# Patient Record
Sex: Female | Born: 1990 | Race: White | Hispanic: No | Marital: Married | Smoking: Former smoker
Health system: Southern US, Community
[De-identification: ages and names within clinical notes are randomized; demographics above are authoritative.]

## PROBLEM LIST (undated history)

## (undated) DIAGNOSIS — G43109 Migraine with aura, not intractable, without status migrainosus: Secondary | ICD-10-CM

## (undated) DIAGNOSIS — F32A Depression, unspecified: Secondary | ICD-10-CM

## (undated) DIAGNOSIS — B2790 Infectious mononucleosis, unspecified without complication: Secondary | ICD-10-CM

## (undated) DIAGNOSIS — F909 Attention-deficit hyperactivity disorder, unspecified type: Secondary | ICD-10-CM

## (undated) DIAGNOSIS — J309 Allergic rhinitis, unspecified: Secondary | ICD-10-CM

## (undated) DIAGNOSIS — R011 Cardiac murmur, unspecified: Secondary | ICD-10-CM

## (undated) DIAGNOSIS — F419 Anxiety disorder, unspecified: Secondary | ICD-10-CM

## (undated) DIAGNOSIS — T7840XA Allergy, unspecified, initial encounter: Secondary | ICD-10-CM

## (undated) HISTORY — DX: Migraine with aura, not intractable, without status migrainosus: G43.109

## (undated) HISTORY — DX: Allergy, unspecified, initial encounter: T78.40XA

## (undated) HISTORY — DX: Infectious mononucleosis, unspecified without complication: B27.90

## (undated) HISTORY — DX: Anxiety disorder, unspecified: F41.9

## (undated) HISTORY — DX: Cardiac murmur, unspecified: R01.1

## (undated) HISTORY — PX: WISDOM TOOTH EXTRACTION: SHX21

## (undated) HISTORY — DX: Allergic rhinitis, unspecified: J30.9

---

## 2016-01-11 ENCOUNTER — Encounter: Payer: Self-pay | Admitting: Emergency Medicine

## 2016-01-11 ENCOUNTER — Emergency Department
Admission: EM | Admit: 2016-01-11 | Discharge: 2016-01-11 | Disposition: A | Discharge status: Home / Self Care | Payer: BLUE CROSS/BLUE SHIELD | Attending: Emergency Medicine | Admitting: Emergency Medicine

## 2016-01-11 ENCOUNTER — Emergency Department: Payer: BLUE CROSS/BLUE SHIELD

## 2016-01-11 DIAGNOSIS — R1011 Right upper quadrant pain: Secondary | ICD-10-CM | POA: Insufficient documentation

## 2016-01-11 DIAGNOSIS — Z3202 Encounter for pregnancy test, result negative: Secondary | ICD-10-CM | POA: Diagnosis not present

## 2016-01-11 DIAGNOSIS — Z880 Allergy status to penicillin: Secondary | ICD-10-CM | POA: Diagnosis not present

## 2016-01-11 DIAGNOSIS — Z87891 Personal history of nicotine dependence: Secondary | ICD-10-CM | POA: Insufficient documentation

## 2016-01-11 LAB — URINALYSIS COMPLETE WITH MICROSCOPIC (ARMC ONLY)
BILIRUBIN URINE: NEGATIVE
Bacteria, UA: NONE SEEN
Glucose, UA: NEGATIVE mg/dL
Hgb urine dipstick: NEGATIVE
KETONES UR: NEGATIVE mg/dL
LEUKOCYTES UA: NEGATIVE
NITRITE: NEGATIVE
PH: 7 (ref 5.0–8.0)
PROTEIN: NEGATIVE mg/dL
SPECIFIC GRAVITY, URINE: 1.017 (ref 1.005–1.030)

## 2016-01-11 LAB — COMPREHENSIVE METABOLIC PANEL
ALK PHOS: 49 U/L (ref 38–126)
ALT: 20 U/L (ref 14–54)
AST: 22 U/L (ref 15–41)
Albumin: 4.3 g/dL (ref 3.5–5.0)
Anion gap: 6 (ref 5–15)
BILIRUBIN TOTAL: 1 mg/dL (ref 0.3–1.2)
BUN: 15 mg/dL (ref 6–20)
CALCIUM: 9 mg/dL (ref 8.9–10.3)
CHLORIDE: 112 mmol/L — AB (ref 101–111)
CO2: 23 mmol/L (ref 22–32)
CREATININE: 0.87 mg/dL (ref 0.44–1.00)
Glucose, Bld: 87 mg/dL (ref 65–99)
Potassium: 4 mmol/L (ref 3.5–5.1)
Sodium: 141 mmol/L (ref 135–145)
TOTAL PROTEIN: 7.6 g/dL (ref 6.5–8.1)

## 2016-01-11 LAB — LIPASE, BLOOD: Lipase: 25 U/L (ref 11–51)

## 2016-01-11 LAB — CBC
HCT: 40.6 % (ref 35.0–47.0)
Hemoglobin: 13.4 g/dL (ref 12.0–16.0)
MCH: 28.8 pg (ref 26.0–34.0)
MCHC: 33 g/dL (ref 32.0–36.0)
MCV: 87.2 fL (ref 80.0–100.0)
PLATELETS: 220 10*3/uL (ref 150–440)
RBC: 4.65 MIL/uL (ref 3.80–5.20)
RDW: 13.7 % (ref 11.5–14.5)
WBC: 9 10*3/uL (ref 3.6–11.0)

## 2016-01-11 LAB — PREGNANCY, URINE: Preg Test, Ur: NEGATIVE

## 2016-01-11 NOTE — Discharge Instructions (Signed)

## 2016-01-11 NOTE — ED Provider Notes (Signed)
[facility] Emergency Department Provider Note  Time seen: 5:49 PM  I have reviewed the triage vital signs and the nursing notes.   HISTORY  Chief Complaint Abdominal Pain    HPI Tiffany [name] is a 25 y.o. female with no past medical history who presents the emergency department with 2-3 days of intermittent right upper quadrant pain. According to the patient approximately 3 days ago she developed some pain to her right mid to upper abdomen, states it lasted several hours and then went away. States it happened one other time and went away briefly. And then back again today. Patient denies any nausea, vomiting, diarrhea, dysuria, vaginal bleeding, vaginal discharge, fever.Denies any known association with food although states her appetite has been decreased over the past several days. Describes the pain as moderate when it occurs, mild currently.    No past medical history on file.  There are no active problems to display for this patient.   No past surgical history on file.  No current outpatient prescriptions on file.  Allergies Penicillins  No family history on file.  Social History Social History  Substance Use Topics  . Smoking status: Former Games developer  . Smokeless tobacco: Not on file  . Alcohol Use: No    Review of Systems Constitutional: Negative for fever. Cardiovascular: Negative for chest pain. Respiratory: Negative for shortness of breath. Gastrointestinal: Right upper quadrant abdominal pain. Negative for nausea, vomiting, diarrhea. Genitourinary: Negative for dysuria. Musculoskeletal: Negative for back pain Neurological: Negative for headaches, focal weakness or numbness.  10-point ROS otherwise negative.  ____________________________________________   PHYSICAL EXAM:  VITAL SIGNS: ED Triage Vitals  Enc Vitals Group     BP 01/11/16 1523 115/72 mmHg     Pulse Rate 01/11/16 1523 65     Resp 01/11/16 1523 16     Temp 01/11/16  1523 97.7 F (36.5 C)     Temp Source 01/11/16 1523 Oral     SpO2 01/11/16 1523 98 %     Weight 01/11/16 1523 155 lb (70.308 kg)     Height 01/11/16 1523  (1.549 m)     Head Cir --      Peak Flow --      Pain Score 01/11/16 1523 5     Pain Loc --      Pain Edu? --      Excl. in GC? --     Constitutional: Alert and oriented. Well appearing and in no distress. Eyes: Normal exam ENT   Head: Normocephalic and atraumatic.   Mouth/Throat: Mucous membranes are moist. Cardiovascular: Normal rate, regular rhythm. No murmur Respiratory: Normal respiratory effort without tachypnea nor retractions. Breath sounds are clear and equal bilaterally. No wheezes/rales/rhonchi. Gastrointestinal: Soft, mild right upper quadrant tenderness to palpation. Abdomen otherwise benign. No rebound or guarding. Distention. Musculoskeletal: Nontender with normal range of motion in all extremities. Neurologic:  Normal speech and language. No gross focal neurologic deficits  Skin:  Skin is warm, dry and intact.  Psychiatric: Mood and affect are normal.   ____________________________________________     RADIOLOGY  Ultrasound within normal limits  ____________________________________________    INITIAL IMPRESSION / ASSESSMENT AND PLAN / ED COURSE  Pertinent labs & imaging results that were available during my care of the patient were reviewed by me and considered in my medical decision making (see chart for details).  Patient presents to the emergency department with intermittent right upper quadrant pain for the past 2-3 days. Contrary to triage  of the patient denies any lower or right lower quadrant abdominal pain. Patient has mild tenderness to palpation in the right upper quadrant currently. States her pain is much reduced from what it was earlier. Patient's workup so far is within normal limits. LFTs and white blood cell count are normal. SGOT it on a lipase as well as a urine pregnancy test.  We will also perform a right upper quadrant ultrasound to rule out biliary disease.  Ultrasound within normal limits. Patient does state she has been having some moderate constipation recently. We'll place on Colace twice a day which she will pick up from the pharmacy. I discussed strict return precautions with the patient and her mother they are agreeable  ____________________________________________   FINAL CLINICAL IMPRESSION(S) / ED DIAGNOSES  Right upper quadrant abdominal pain   Minna Antis, MD 01/11/16 2001

## 2016-01-11 NOTE — ED Notes (Signed)
AAOx3.  Skin warm and dry.  NAD 

## 2016-01-11 NOTE — ED Notes (Signed)
Reports RLQ pain a couple days ago that resolved and then came back this morning.  Denies NVD.  Denies fevers.  Pain is to RLQ but not made worse by anything per pt.

## 2016-01-11 NOTE — ED Notes (Signed)
Pt reports intermittent right lower abd pain.  No n/v/d.  No back pain.  Constipation for past 2-3 days.  No fever.  Pt alert

## 2017-03-15 IMAGING — US US ABDOMEN LIMITED
1 series · 14 of 25 positions shown · non-contrast
Comparison: None.

CLINICAL DATA: Two day history of right upper quadrant pain

EXAM:
US ABDOMEN LIMITED - RIGHT UPPER QUADRANT

[Series 1: us abdomen limited · 0.15mm/px · 14 of 78 slices shown]
[im 1/78]
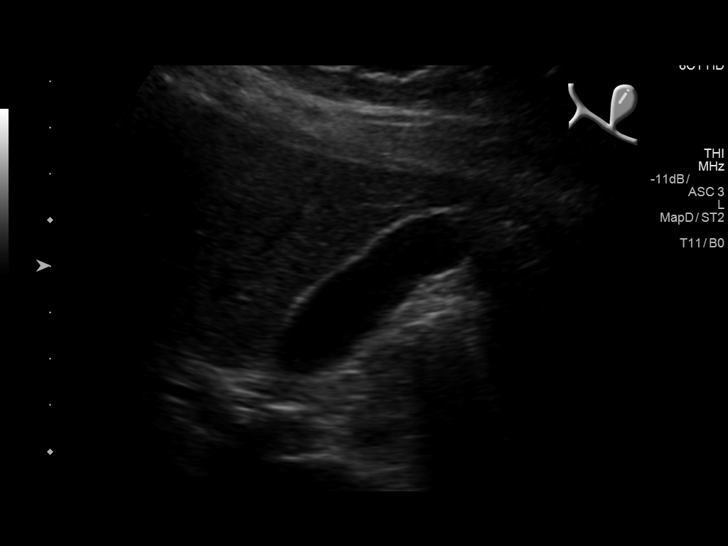
[im 7/78]
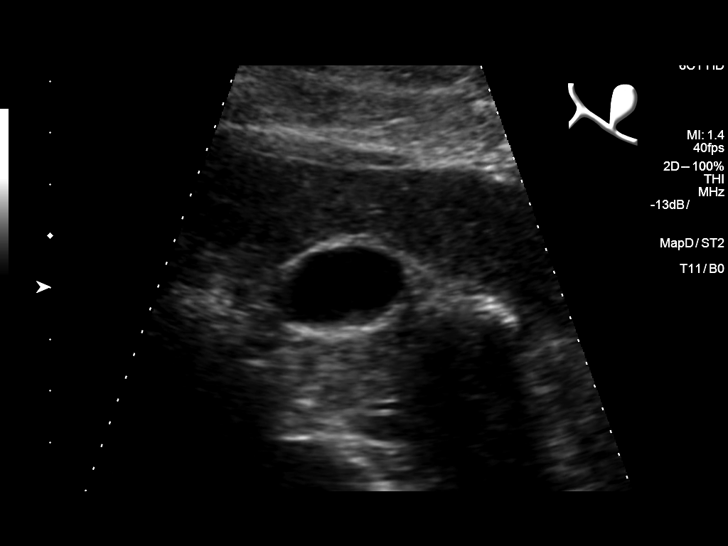
[im 13/78]
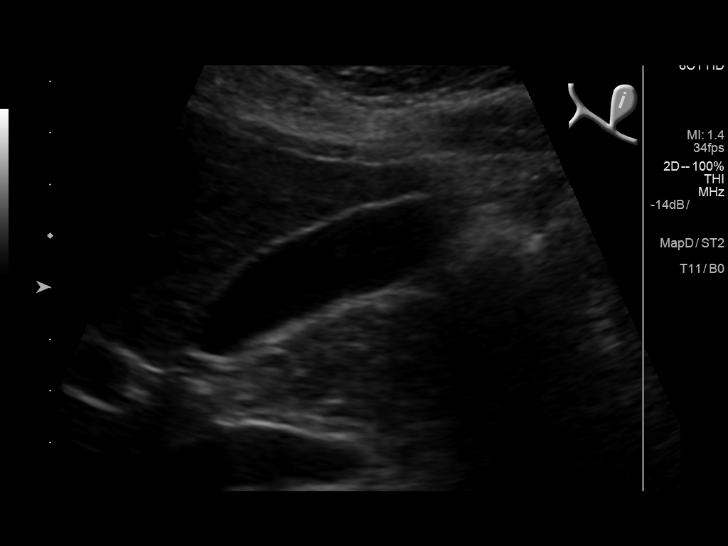
[im 20/78]
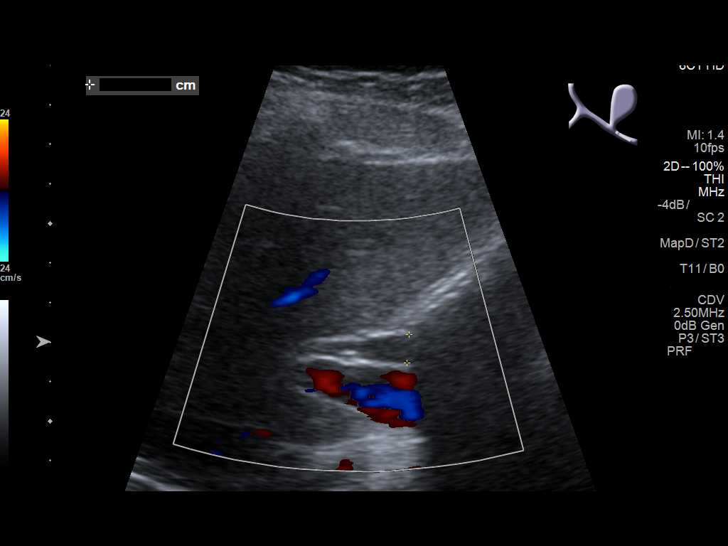
[im 26/78]
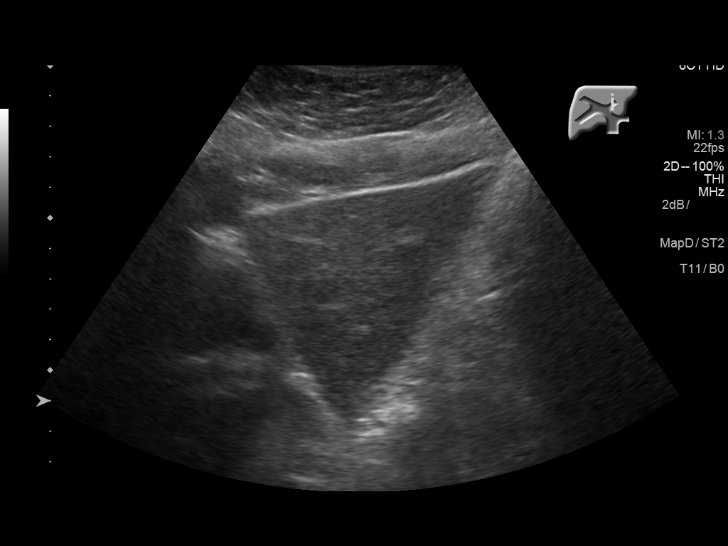
[im 29/78]
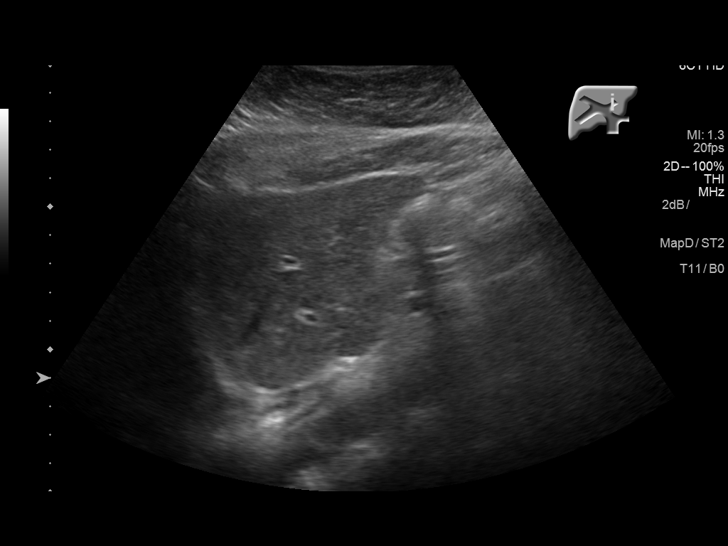
[im 36/78]
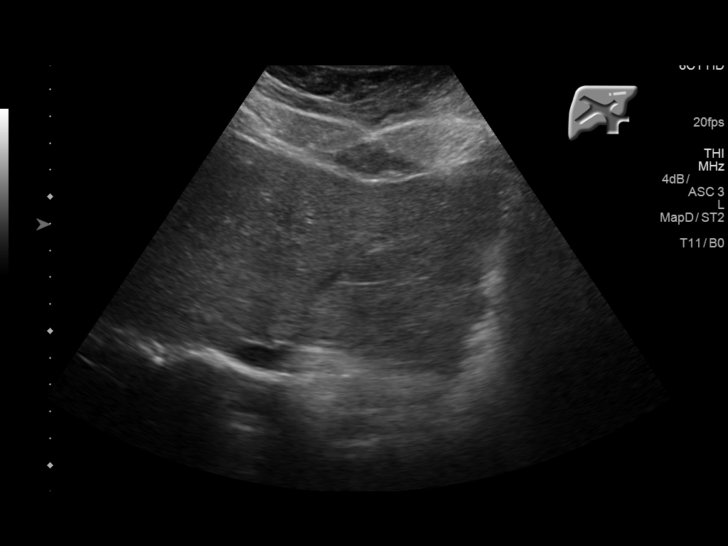
[im 42/78]
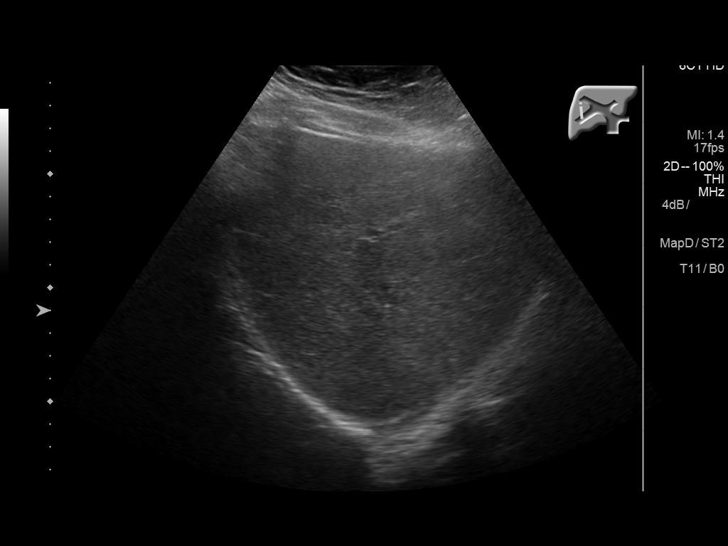
[im 49/78]
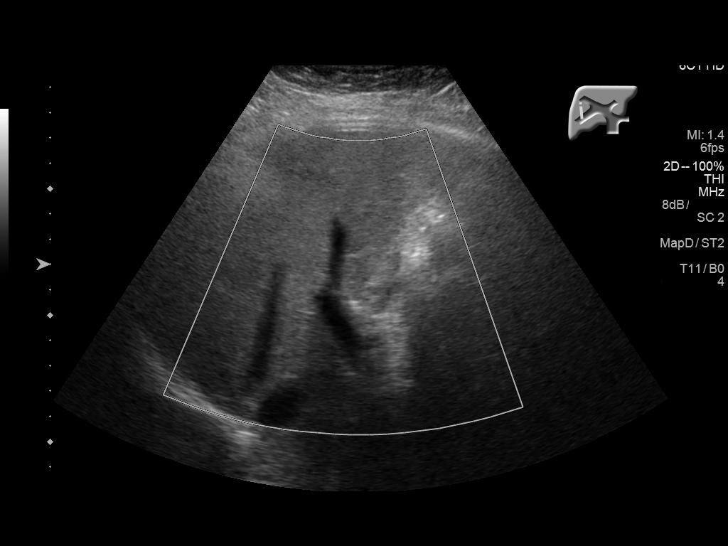
[im 52/78]
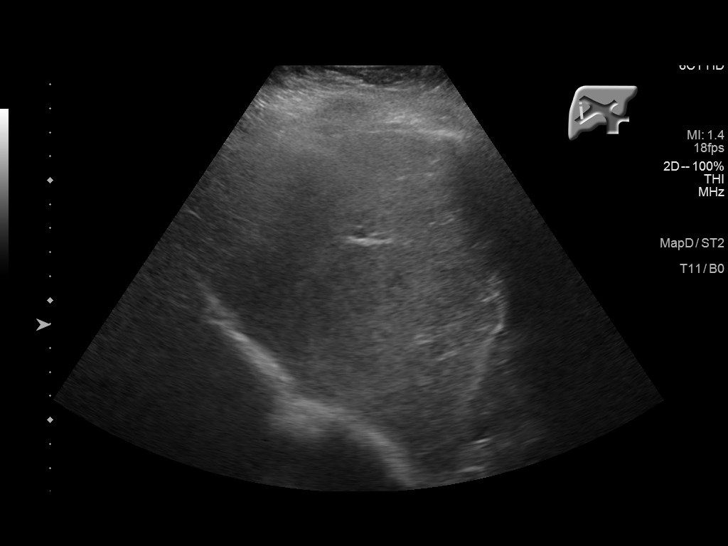
[im 58/78]
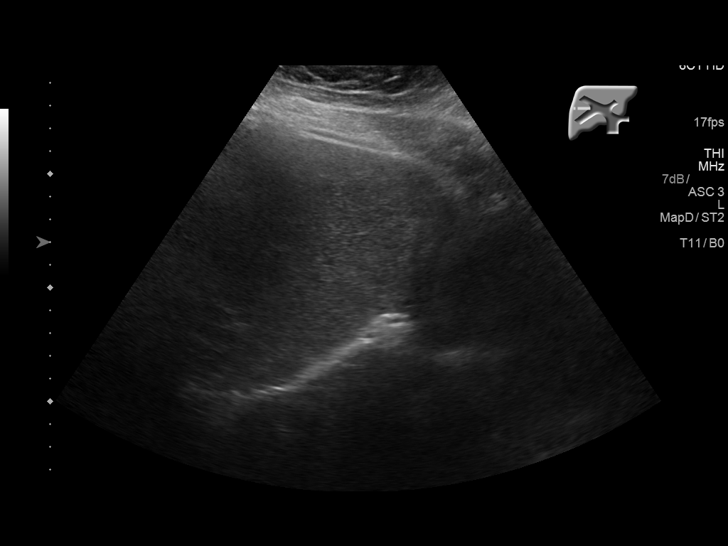
[im 65/78]
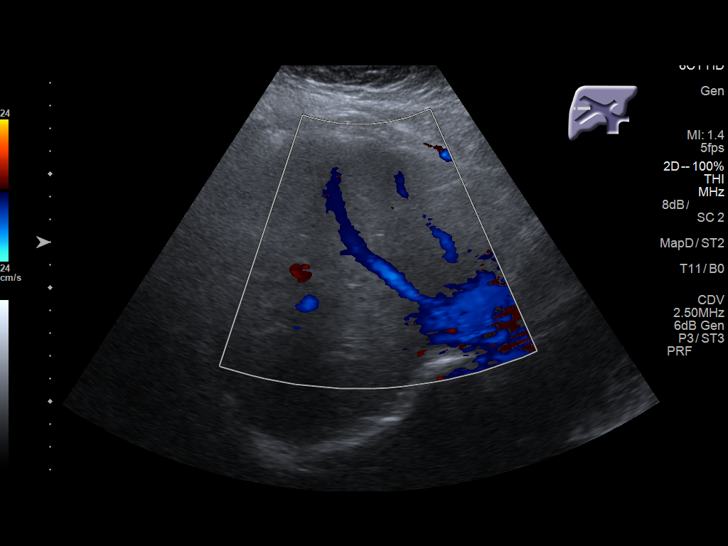
[im 71/78]
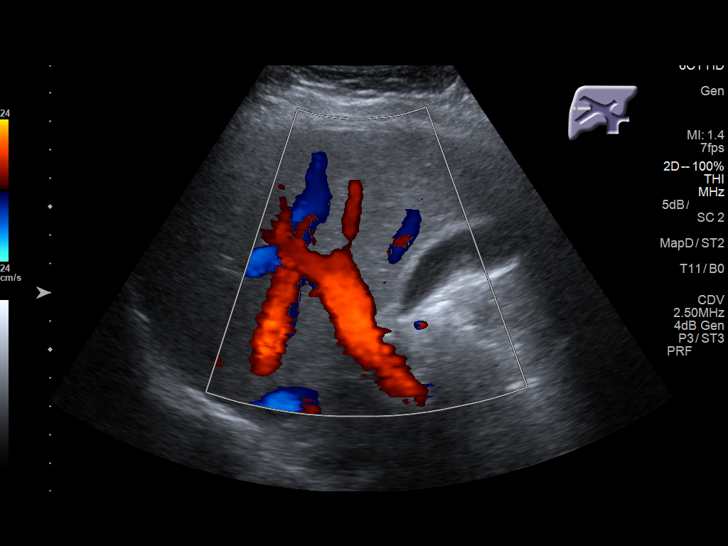
[im 78/78]
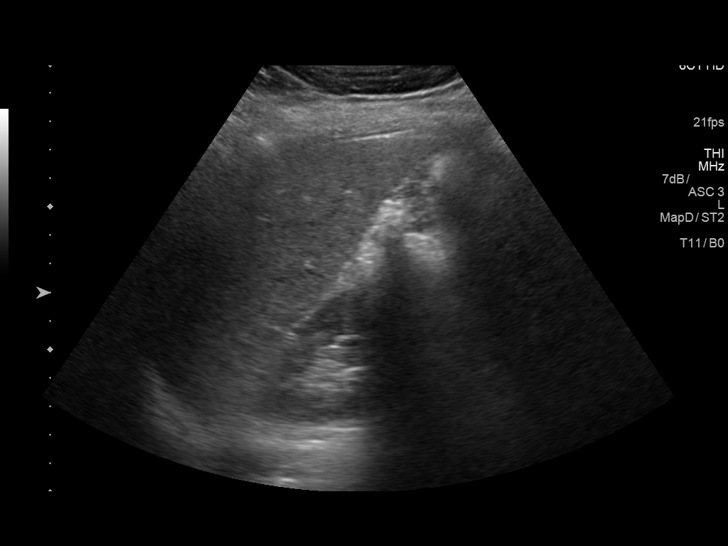

[14 of 25 positions shown; findings below may reference images not displayed]

FINDINGS: Gallbladder:

No gallstones or wall thickening visualized. There is no
pericholecystic fluid. No sonographic Murphy sign noted by
sonographer.

Common bile duct:

Diameter: 7 mm proximally tapering to 5 mm. No mass or calculus
appreciable in the biliary duct system.

Liver:

No focal lesion identified. Within normal limits in parenchymal
echogenicity.
IMPRESSION: Study within normal limits.

## 2017-05-07 ENCOUNTER — Ambulatory Visit (INDEPENDENT_AMBULATORY_CARE_PROVIDER_SITE_OTHER): Payer: BLUE CROSS/BLUE SHIELD | Admitting: Certified Nurse Midwife

## 2017-05-07 ENCOUNTER — Encounter: Payer: Self-pay | Admitting: Certified Nurse Midwife

## 2017-05-07 VITALS — BP 100/60 | HR 72 | Ht 61.0 in | Wt 162.0 lb

## 2017-05-07 DIAGNOSIS — Z01419 Encounter for gynecological examination (general) (routine) without abnormal findings: Secondary | ICD-10-CM

## 2017-05-07 DIAGNOSIS — Z113 Encounter for screening for infections with a predominantly sexual mode of transmission: Secondary | ICD-10-CM | POA: Diagnosis not present

## 2017-05-07 DIAGNOSIS — Z124 Encounter for screening for malignant neoplasm of cervix: Secondary | ICD-10-CM

## 2017-05-07 DIAGNOSIS — N946 Dysmenorrhea, unspecified: Secondary | ICD-10-CM

## 2017-05-07 DIAGNOSIS — Z308 Encounter for other contraceptive management: Secondary | ICD-10-CM

## 2017-05-07 DIAGNOSIS — N920 Excessive and frequent menstruation with regular cycle: Secondary | ICD-10-CM | POA: Diagnosis not present

## 2017-05-07 NOTE — Progress Notes (Addendum)
Gynecology Annual Exam  PCP: Marina GoodellFeldpausch, Dale E, MD  Chief Complaint:  Chief Complaint  Patient presents with  . Gynecologic Exam    History of Present Illness: Tiffany [name] is a 26 y.o. G0, lesbian female who presents with her wife for a NP annual exam. This is her first gyn exam. The patient also would like to start birth control pills to help her with her heavy crampy menses.  LMP: Patient's last menstrual period was 04/10/2017. Her menses are monthly and regular, lasting 5 days with 3-4 heavy days with quarter sized clots requiring a tampon change every 2-3 hours. Dysmenorrhea begins 2 days before her menses and lasts through the first 2 days of her menses. She takes ibuprofen 400-800 mgm BID prn.  Her medical history is significant for migraine with aura and allergic rhinitis. The patient is sexually active with her female partner.  There is no notable family history of breast or ovarian cancer in her family. She does not do self breast exams The patient smokes less than 1 pack/week. She drinks 1 glass of alcohol/week. The patient has regular exercise: no, but she gets over 10, 000 steps/day. BMI=30.61 kg/m2 She does get adequate calcium in her diet. She has not had recent cholesterol screening.   The patient denies current symptoms of depression.    Review of Systems: Review of Systems  Constitutional: Negative for chills, fever and weight loss.  HENT: Positive for congestion. Negative for sinus pain and sore throat.   Eyes: Negative for blurred vision and pain.  Respiratory: Negative for hemoptysis, shortness of breath and wheezing.   Cardiovascular: Positive for palpitations (with anxiety). Negative for chest pain and leg swelling.  Gastrointestinal: Positive for heartburn. Negative for abdominal pain, blood in stool, diarrhea, nausea and vomiting.  Genitourinary: Negative for dysuria, frequency, hematuria and urgency.  Musculoskeletal: Negative for back pain, joint  pain and myalgias.  Skin: Negative for itching and rash.  Neurological: Positive for headaches (migraines 1-3/month). Negative for dizziness and tingling.  Endo/Heme/Allergies: Negative for environmental allergies and polydipsia. Does not bruise/bleed easily.       Negative for hirsutism   Psychiatric/Behavioral: Negative for depression. The patient is nervous/anxious. The patient does not have insomnia.     Past Medical History:  Past Medical History:  Diagnosis Date  . Allergic rhinitis   . Migraine with aura   . Mononucleosis    26 yo    Past Surgical History:  Past Surgical History:  Procedure Laterality Date  . WISDOM TOOTH EXTRACTION      Family History:  Family History  Problem Relation Age of Onset  . Melanoma Mother   . Breast cancer Neg Hx   . Ovarian cancer Neg Hx     Social History:  Social History   Social History  . Marital status: Married    Spouse name: Tiffany [name]  . Number of children: 0  . Years of education: N/A   Occupational History  . Bartender    Social History Main Topics  . Smoking status: Current Some Day Smoker    Types: Cigarettes  . Smokeless tobacco: Never Used  . Alcohol use Yes     Comment: 1/week  . Drug use: No  . Sexual activity: Yes    Partners: Female    Birth control/ protection: None   Other Topics Concern  . Not on file   Social History Narrative  . No narrative on file    Allergies:  Allergies  Allergen Reactions  . Penicillins Rash    Medications:  Current Outpatient Prescriptions:  .  chlorpheniramine (CHLOR-TRIMETON) 4 MG tablet, Take 4 mg by mouth every 6 (six) hours as needed for allergies., Disp: , Rfl:  .  cholecalciferol (VITAMIN D) 1000 units tablet, Take 1,000 Units by mouth daily., Disp: , Rfl:  .  ECHINACEA EXTRACT PO, Take 1 tablet by mouth daily., Disp: , Rfl:  .  vitamin C (ASCORBIC ACID) 500 MG tablet, Take 500 mg by mouth daily., Disp: , Rfl:    Physical Exam Vitals: Blood pressure  100/60, pulse 72, height 5\' 1"  (1.549 m), weight 162 lb (73.5 kg), BMI=30.61 last menstrual period 04/10/2017.  General: WF in NAD HEENT: normocephalic, anicteric Thyroid: no enlargement, no palpable nodules Pulmonary: No increased work of breathing, CTAB Cardiovascular: RRR without murmur Breast: Breast symmetrical, no tenderness, no palpable nodules or masses, no skin or nipple retraction present, no nipple discharge.  No axillary, infraclavicular,  or supraclavicular lymphadenopathy. Abdomen: soft, non-tender, non-distended.  Umbilicus without lesions.  No hepatomegaly, splenomegaly or masses palpable. No evidence of hernia  Genitourinary:  External: Normal external female genitalia.  Normal urethral meatus, normal Bartholin's and Skene's glands.    Vagina: Normal vaginal mucosa, no evidence of prolapse.    Cervix: everted, friable with Pap, posterior   Uterus: RF, immobile, normal size and contour, NT  Adnexa: ovaries non-enlarged, no adnexal masses, NT  Rectal: deferred  Lymphatic: no evidence of inguinal lymphadenopathy Extremities: no edema, erythema, or tenderness Neurologic: Grossly intact Psychiatric: mood appropriate, affect full     Assessment: 26 y.o. G0P0000 with menorrhagia and dysmenorrhea Migraine with aura  Plan: Problem List Items Addressed This Visit    Dysmenorrhea   Menorrhagia with regular cycle    Other Visit Diagnoses    Encounter for gynecological examination    -  Primary   Relevant Orders   PAP IG, CT-NG, RFX HPV ALL   Screening for STD (sexually transmitted disease)       Relevant Orders   PAP IG, CT-NG, RFX HPV ALL   Screening for cervical cancer       Relevant Orders   PAP IG, CT-NG, RFX HPV ALL   Encounter for other contraceptive management          1) Discussed treatment for menorrhagia and dysmenorrhea including NSAIDS, Lysteda, pills, Depo, Nexplanon, and progesterone IUDs (specifically [place]). Explained to patient that she  would not be a good candidate for combined oral contraceptives due to her classic migraines. Advised she is at risk for stroke with combined pills, but POP would be OK to take. She decided to get the [place] IUD. Explained risks of expulsion, perforation of uterus, infection and irregular bleeding. Will order the [facility] and have patient return on her menses for insertion. Recommend she premedicate with ibuprofen prior to the procedure.   2) Paptima was done   3) Routine healthcare maintenance including cholesterol, diabetes screening discussed and declined at this time  Farrel Conners, CNM

## 2017-05-09 LAB — PAP IG, CT-NG, RFX HPV ALL
CHLAMYDIA, NUC. ACID AMP: NEGATIVE
Gonococcus by Nucleic Acid Amp: NEGATIVE
PAP SMEAR COMMENT: 0

## 2017-05-11 ENCOUNTER — Telehealth: Payer: Self-pay | Admitting: Certified Nurse Midwife

## 2017-05-11 NOTE — Telephone Encounter (Signed)
Pt is schedule with [facility] 05/16/17 for Kyleena insertion.

## 2017-05-14 NOTE — Telephone Encounter (Signed)
Kyleena stock reserved for this patient. 

## 2017-05-16 ENCOUNTER — Encounter: Payer: Self-pay | Admitting: Certified Nurse Midwife

## 2017-05-16 ENCOUNTER — Ambulatory Visit (INDEPENDENT_AMBULATORY_CARE_PROVIDER_SITE_OTHER): Payer: BLUE CROSS/BLUE SHIELD | Admitting: Certified Nurse Midwife

## 2017-05-16 VITALS — BP 100/60 | HR 74 | Ht 61.0 in | Wt 153.0 lb

## 2017-05-16 DIAGNOSIS — Z30430 Encounter for insertion of intrauterine contraceptive device: Secondary | ICD-10-CM

## 2017-05-16 MED ORDER — LEVONORGESTREL 19.5 MG IU IUD: 1.0000 | INTRAUTERINE_SYSTEM | Freq: Once | INTRAUTERINE | Status: DC

## 2017-05-16 NOTE — Progress Notes (Signed)
    GYNECOLOGY OFFICE PROCEDURE NOTE  Concepcion ElkChelsea Berger is a 26 y.o. G0P0000 [facility]. No GYN concerns.  Last pap smear was on 05/07/2017 and was normal. LMP 11 May 2017  IUD Insertion Procedure Note Patient identified, informed consent performed, consent signed.   Discussed risks of irregular bleeding, cramping, infection, expulsion,malpositioning or misplacement of the IUD outside the uterus which may require further procedure such as laparoscopy. Time out was performed.    On bimanual exam, uterus was Retroverted. Speculum placed in the vagina.  Cervix visualized.  Cleaned with Betadine x 3. Cervix was sprayed with Hurricaine anesthetic and  grasped posteriorly with a single tooth tenaculum.  Uterus sounded to 6 cm.  Kyleena  IUD placed per manufacturer's recommendations.  Strings trimmed to 3 cm. Tenaculum was removed, and silver nitrate was applied to tenaculum sites for hemostasis.  Patient tolerated procedure well.   Patient was given post-procedure instructions.   Patient was also asked to check IUD strings periodically and follow up in 4 weeks for IUD check. RTO prn fever and abdominal pain   Farrel ConnersColleen Heidy Mccubbin, [place]M 05/16/17

## 2017-05-16 NOTE — Telephone Encounter (Signed)
kyleena inserted today and recorded in device log book.

## 2017-05-22 ENCOUNTER — Telehealth: Payer: Self-pay

## 2017-05-22 NOTE — Telephone Encounter (Signed)
Pt calling - had IUD inserted last week.  She is now cramping a little and bleeding is a little heavier today than yesterday.  Normal or be concerned?  Adv normal as it take the body 337m to adjust to a bc.  Cramping normal as long as not doubling her over or stopping her in her tracks.  Bleeding normal as long as not saturating a pad q3430min-1hr.  Pt reassured.

## 2017-06-19 ENCOUNTER — Encounter: Payer: Self-pay | Admitting: Certified Nurse Midwife

## 2017-06-19 ENCOUNTER — Ambulatory Visit (INDEPENDENT_AMBULATORY_CARE_PROVIDER_SITE_OTHER): Payer: BLUE CROSS/BLUE SHIELD | Admitting: Certified Nurse Midwife

## 2017-06-19 VITALS — BP 100/60 | HR 63 | Ht 60.0 in | Wt 159.0 lb

## 2017-06-19 DIAGNOSIS — Z30431 Encounter for routine checking of intrauterine contraceptive device: Secondary | ICD-10-CM

## 2017-06-19 NOTE — Progress Notes (Signed)
  History of Present Illness:  Tiffany [name] is a 26 y.o. that had a [place] IUD placed approximately 1 month ago. Since that time, she states that she has had persistent spotting and bleeding. Her LMP was 4319 June. Her menses was lighter than usual but prolonged and she continue to have a light flow. Has not had any fever. Will occasionally experience a sharp stabbing lower abdominal pains. Has not had any pains since her LMP started.   PMHx: She  has a past medical history of Allergic rhinitis; Migraine with aura; and Mononucleosis. Also,  has a past surgical history that includes Wisdom tooth extraction., family history is not on file.,  reports that she has been smoking Cigarettes.  She has never used smokeless tobacco. She reports that she drinks alcohol. She reports that she does not use drugs.   Also, is allergic to penicillins.  ROS  Physical Exam:  BP 100/60   Pulse 63   Ht 5' (1.524 m)   Wt 159 lb (72.1 kg)   LMP 06/12/2017 (Exact Date)   BMI 31.05 kg/m  Body mass index is 31.05 kg/m. Constitutional: Well nourished, well developed female in no acute distress.  Abdomen: diffusely non tender to palpation, non distended Neuro: Grossly intact Psych:  Normal mood and affect.    Pelvic exam: External/BUS: no lesion, no discharge Vagina: no lesions, small amount of blood in vault Cervix: Two IUD strings present seen coming from the cervical os. Uterus: RF, decreased mobility, NT, NSSC  Assessment: IUD strings present in proper location; having persistent bleeding since IUD inserted-not unusual as endometrium thins out   Plan: She was told that the bleeding should subside after another month or so and to return to office for persistent bleeding after that. She was also told to report persistent or worsening pelvic pain.  She was amenable to this plan and we will see her back in 1 year/PRN.  Farrel Connersolleen Baruch Lewers, CNM

## 2017-08-23 ENCOUNTER — Ambulatory Visit: Payer: BLUE CROSS/BLUE SHIELD | Admitting: Obstetrics and Gynecology

## 2017-11-22 ENCOUNTER — Ambulatory Visit: Payer: Self-pay | Admitting: Physician Assistant

## 2017-12-12 ENCOUNTER — Other Ambulatory Visit: Payer: Self-pay

## 2017-12-12 ENCOUNTER — Encounter: Payer: Self-pay | Admitting: Family Medicine

## 2017-12-12 ENCOUNTER — Ambulatory Visit (INDEPENDENT_AMBULATORY_CARE_PROVIDER_SITE_OTHER): Payer: BLUE CROSS/BLUE SHIELD | Admitting: Family Medicine

## 2017-12-12 VITALS — BP 102/64 | HR 89 | Temp 98.1°F | Resp 16 | Ht 60.0 in | Wt 172.0 lb

## 2017-12-12 DIAGNOSIS — N946 Dysmenorrhea, unspecified: Secondary | ICD-10-CM

## 2017-12-12 DIAGNOSIS — F419 Anxiety disorder, unspecified: Secondary | ICD-10-CM | POA: Diagnosis not present

## 2017-12-12 DIAGNOSIS — G43109 Migraine with aura, not intractable, without status migrainosus: Secondary | ICD-10-CM | POA: Diagnosis not present

## 2017-12-12 DIAGNOSIS — J309 Allergic rhinitis, unspecified: Secondary | ICD-10-CM | POA: Insufficient documentation

## 2017-12-12 DIAGNOSIS — J3089 Other allergic rhinitis: Secondary | ICD-10-CM | POA: Diagnosis not present

## 2017-12-12 DIAGNOSIS — N920 Excessive and frequent menstruation with regular cycle: Secondary | ICD-10-CM

## 2017-12-12 LAB — COMPLETE METABOLIC PANEL WITH GFR
AG Ratio: 2 (calc) (ref 1.0–2.5)
ALBUMIN MSPROF: 4.5 g/dL (ref 3.6–5.1)
ALT: 21 U/L (ref 6–29)
AST: 20 U/L (ref 10–30)
Alkaline phosphatase (APISO): 50 U/L (ref 33–115)
BUN: 11 mg/dL (ref 7–25)
CALCIUM: 9.5 mg/dL (ref 8.6–10.2)
CO2: 27 mmol/L (ref 20–32)
Chloride: 105 mmol/L (ref 98–110)
Creat: 0.55 mg/dL (ref 0.50–1.10)
GFR, EST AFRICAN AMERICAN: 149 mL/min/{1.73_m2} (ref >=60)
GFR, EST NON AFRICAN AMERICAN: 129 mL/min/{1.73_m2} (ref >=60)
GLOBULIN: 2.3 g/dL (ref 1.9–3.7)
Glucose, Bld: 86 mg/dL (ref 65–99)
Potassium: 4.1 mmol/L (ref 3.5–5.3)
SODIUM: 137 mmol/L (ref 135–146)
TOTAL PROTEIN: 6.8 g/dL (ref 6.1–8.1)
Total Bilirubin: 0.4 mg/dL (ref 0.2–1.2)

## 2017-12-12 LAB — CBC WITH DIFFERENTIAL/PLATELET
BASOS PCT: 0.9 %
Basophils Absolute: 50 cells/uL (ref 0–200)
EOS ABS: 112 {cells}/uL (ref 15–500)
Eosinophils Relative: 2 %
HEMATOCRIT: 39.8 % (ref 35.0–45.0)
Hemoglobin: 13.7 g/dL (ref 11.7–15.5)
LYMPHS ABS: 1361 {cells}/uL (ref 850–3900)
MCH: 29.7 pg (ref 27.0–33.0)
MCHC: 34.4 g/dL (ref 32.0–36.0)
MCV: 86.1 fL (ref 80.0–100.0)
MPV: 10.6 fL (ref 7.5–12.5)
Monocytes Relative: 9.7 %
NEUTROS PCT: 63.1 %
Neutro Abs: 3534 cells/uL (ref 1500–7800)
Platelets: 233 10*3/uL (ref 140–400)
RBC: 4.62 10*6/uL (ref 3.80–5.10)
RDW: 12.7 % (ref 11.0–15.0)
Total Lymphocyte: 24.3 %
WBC: 5.6 10*3/uL (ref 3.8–10.8)
WBCMIX: 543 {cells}/uL (ref 200–950)

## 2017-12-12 LAB — TSH: TSH: 0.57 mIU/L

## 2017-12-12 MED ORDER — CITALOPRAM HYDROBROMIDE 20 MG PO TABS: 20.0000 mg | ORAL_TABLET | Freq: Every day | ORAL | 3 refills | Status: DC

## 2017-12-12 MED ORDER — SUMATRIPTAN SUCCINATE 50 MG PO TABS
50.0000 mg | ORAL_TABLET | ORAL | 1 refills | Status: DC | PRN
Start: 2017-12-12 — End: 2020-10-01

## 2017-12-12 NOTE — Assessment & Plan Note (Signed)
Improved with IUD in place Could consider Mirena for slightly higher dose progesterone in the future Estrogen contraindicated due to fam h/o VTE and migraines with aura

## 2017-12-12 NOTE — Assessment & Plan Note (Signed)
Infrequent, does not need preventive medicine Trial of triptan for abortion Discussed using at first sign of migraine and repeating after 2h if still present No more than 2 doses in 24h or 3 doses per week Return precautions discussed

## 2017-12-12 NOTE — Assessment & Plan Note (Signed)
Well controlled Continue claritin OTC

## 2017-12-12 NOTE — Assessment & Plan Note (Signed)
Resolved with IUD in place Continue

## 2017-12-12 NOTE — Assessment & Plan Note (Signed)
Uncontrolled and significant Start celexa 20mg  daily Discussed can take 6-8 weeks to reach full efficacy, possible SEs - GI upset and sexual dysfunction Will f/u in 4 weeks and consider dose titration Discussed safety precautions if she were to have increased suicidality

## 2017-12-12 NOTE — Patient Instructions (Signed)

## 2017-12-12 NOTE — Progress Notes (Signed)
Patient: Tiffany [name], Female    DOB: 1991-08-29, 26 y.o.   MRN: [id number] Visit Date: 12/12/2017  Today's Provider: Shirlee Latch, MD   Chief Complaint  Patient presents with  . Establish Care   I, Emily Ratchford, CMA, am acting as scribe for Shirlee Latch, MD.  Subjective:    Establish Care Tiffany [name] is a 26 y.o. female who presents today for health maintenance and to establish care. She feels fairly well. She is c/o anxiety. 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 She reports she is sleeping poorly.  She declines flu vaccine today. She would like a Tdap at a later date, as she has to work after this OV.  Anxiety: Patient complains of anxiety disorder.  She has the following symptoms: chest pain, difficulty concentrating, dizziness, fatigue, insomnia, irritable, palpitations, racing thoughts, shortness of breath, myalgias and biting nails. Onset of symptoms was approximately several years ago, gradually worsening since that time. She denies current suicidal and homicidal ideation. Family history significant for alcoholism and anxiety.Possible organic causes contributing are: none. Previous treatment includes Celexa about 10 years ago - being treated for depression. Pt does not remember if this treatment was effective, doesn't think she was on it long enough for it to be effective. She denies having side effects from the medication. She states she has "night terrors".  GAD 7 : Generalized Anxiety Score 12/12/2017  Nervous, Anxious, on Edge 3  Control/stop worrying 2  Worry too much - different things 2  Trouble relaxing 3  Restless 1  Easily annoyed or irritable 1  Afraid - awful might happen 3  Total GAD 7 Score 15  Anxiety Difficulty Extremely difficult    ----------------------------------------------------------------- Abd pain: Was seen at an UC for this. KUB showed constipation.  Was referred to [facility] since taking Colace.  Miralax gave diarrhea.  Migraines: with aura, photo/phonophobia.  They are debilitating for a day.  +N/V, occur q3-6 months.  Has never had complex migraine symptoms.  Never tried triptan.    Has been told she had a heart murmur when she was 26 y/o  Allergic rhinitis: year round.  Taking claritin daily.  IUD: Kyleena placed 05/11/17. Very light periods that are regular now.  Dysmenorrhea resolved.  Review of Systems  Constitutional: Positive for activity change and fatigue. Negative for appetite change, chills, diaphoresis, fever and unexpected weight change.  HENT: Positive for congestion, postnasal drip, rhinorrhea, sinus pressure, sinus pain and sneezing. Negative for dental problem, drooling, ear discharge, ear pain, facial swelling, hearing loss, mouth sores, nosebleeds, sore throat, tinnitus, trouble swallowing and voice change.   Eyes: Positive for visual disturbance. Negative for photophobia, pain, discharge, redness and itching.  Respiratory: Positive for chest tightness and shortness of breath. Negative for apnea, cough, choking, wheezing and stridor.   Cardiovascular: Negative.   Gastrointestinal: Positive for abdominal distention, abdominal pain, anal bleeding, constipation and rectal pain. Negative for blood in stool, diarrhea, nausea and vomiting.  Endocrine: Negative.   Genitourinary: Positive for flank pain. Negative for decreased urine volume, difficulty urinating, dyspareunia, dysuria, enuresis, frequency, genital sores, hematuria, menstrual problem, pelvic pain, urgency, vaginal bleeding, vaginal discharge and vaginal pain.  Musculoskeletal: Positive for arthralgias, back pain, myalgias, neck pain and neck stiffness. Negative for gait problem and joint swelling.  Skin: Negative.   Allergic/Immunologic: Positive for environmental allergies. Negative for food allergies and immunocompromised state.  Neurological: Positive for light-headedness and headaches. Negative for  dizziness, tremors, seizures,  syncope, facial asymmetry, speech difficulty, weakness and numbness.  Hematological: Negative.   Psychiatric/Behavioral: Positive for agitation, decreased concentration, dysphoric mood and sleep disturbance. Negative for behavioral problems, confusion, hallucinations, self-injury and suicidal ideas. The patient is nervous/anxious. The patient is not hyperactive.     Social History      She  reports that she has been smoking cigarettes.  she has never used smokeless tobacco. She reports that she drinks alcohol. She reports that she does not use drugs.       Social History   Socioeconomic History  . Marital status: Married    Spouse name: [name]  . Number of children: 0  . Years of education: Not on file  . Highest education level: Not on file  Social Needs  . Financial resource strain: Not on file  . Food insecurity - worry: Not on file  . Food insecurity - inability: Not on file  . Transportation needs - medical: Not on file  . Transportation needs - non-medical: Not on file  Occupational History  . Occupation: Bartender  Tobacco Use  . Smoking status: Current Some Day Smoker    Types: Cigarettes  . Smokeless tobacco: Never Used  Substance and Sexual Activity  . Alcohol use: Yes    Comment: 1/week  . Drug use: No  . Sexual activity: Yes    Partners: Female    Birth control/protection: None  Other Topics Concern  . Not on file  Social History Narrative  . Not on file    Past Medical History:  Diagnosis Date  . Allergic rhinitis   . Migraine with aura   . Mononucleosis    26 yo     Patient Active Problem List   Diagnosis Date Noted  . Dysmenorrhea 05/07/2017  . Menorrhagia with regular cycle 05/07/2017    Past Surgical History:  Procedure Laterality Date  . WISDOM TOOTH EXTRACTION      Family History        Family Status  Relation Name Status  . Neg Hx  (Not Specified)        Her family history is not on file.      Allergies  Allergen Reactions  . Penicillins Rash     Current Outpatient Medications:  .  docusate sodium (COLACE) 250 MG capsule, Take 250 mg by mouth daily., Disp: , Rfl:  .  loratadine (CLARITIN) 10 MG tablet, Take 10 mg by mouth daily., Disp: , Rfl:  .  polyethylene glycol (MIRALAX / GLYCOLAX) packet, Take 17 g by mouth daily., Disp: , Rfl:   Current Facility-Administered Medications:  .  Levonorgestrel IUD 1 Device, 1 Device, Intrauterine, Once, Farrel ConnersGutierrez, Colleen, CNM   Patient Care Team: Erasmo DownerBacigalupo, Pericles Carmicheal M, MD as PCP - General (Family Medicine)      Objective:   Vitals: BP 102/64 (BP Location: Left Arm, Patient Position: Sitting, Cuff Size: Normal)   Pulse 89   Temp 98.1 F (36.7 C) (Oral)   Resp 16   Ht 5' (1.524 m)   Wt 172 lb (78 kg)   LMP 11/28/2017   SpO2 99%   BMI 33.59 kg/m    Vitals:   12/12/17 1511  BP: 102/64  Pulse: 89  Resp: 16  Temp: 98.1 F (36.7 C)  TempSrc: Oral  SpO2: 99%  Weight: 172 lb (78 kg)  Height: 5' (1.524 m)     Physical Exam  Constitutional: She is oriented to person, place, and time. She appears well-developed and well-nourished. No distress.  HENT:  Head: Normocephalic and atraumatic.  Right Ear: External ear normal.  Left Ear: External ear normal.  Nose: Nose normal.  Mouth/Throat: Oropharynx is clear and moist. No oropharyngeal exudate.  Eyes: Conjunctivae and EOM are normal. Pupils are equal, round, and reactive to light. No scleral icterus.  Neck: Neck supple. No thyromegaly present.  Cardiovascular: Normal rate, regular rhythm, normal heart sounds and intact distal pulses.  No murmur heard. Pulmonary/Chest: Effort normal and breath sounds normal. No respiratory distress. She has no wheezes. She has no rales.  Abdominal: Soft. Bowel sounds are normal. She exhibits no distension. There is no tenderness. There is no rebound and no guarding.  Musculoskeletal: She exhibits no edema or deformity.  Lymphadenopathy:     She has no cervical adenopathy.  Neurological: She is alert and oriented to person, place, and time. No cranial nerve deficit.  Skin: Skin is warm and dry. No rash noted.  Psychiatric: She has a normal mood and affect. Her behavior is normal.  Vitals reviewed.    Depression Screen PHQ 2/9 Scores 12/12/2017  PHQ - 2 Score 4  PHQ- 9 Score 14      Assessment & Plan:   Problem List Items Addressed This Visit      Cardiovascular and Mediastinum   Migraine with aura    Infrequent, does not need preventive medicine Trial of triptan for abortion Discussed using at first sign of migraine and repeating after 2h if still present No more than 2 doses in 24h or 3 doses per week Return precautions discussed      Relevant Medications   SUMAtriptan (IMITREX) 50 MG tablet   citalopram (CELEXA) 20 MG tablet   Other Relevant Orders   CBC w/Diff/Platelet   COMPLETE METABOLIC PANEL WITH GFR     Respiratory   Allergic rhinitis    Well controlled Continue claritin OTC        Genitourinary   Dysmenorrhea    Resolved with IUD in place Continue         Other   Menorrhagia with regular cycle    Improved with IUD in place Could consider Mirena for slightly higher dose progesterone in the future Estrogen contraindicated due to fam h/o VTE and migraines with aura      Relevant Orders   CBC w/Diff/Platelet   TSH   Anxiety - Primary    Uncontrolled and significant Start celexa 20mg  daily Discussed can take 6-8 weeks to reach full efficacy, possible SEs - GI upset and sexual dysfunction Will f/u in 4 weeks and consider dose titration Discussed safety precautions if she were to have increased suicidality      Relevant Medications   citalopram (CELEXA) 20 MG tablet   Other Relevant Orders   CBC w/Diff/Platelet   COMPLETE METABOLIC PANEL WITH GFR   TSH      Return in about 4 weeks (around 01/09/2018) for anxiety, migraine f/u.     -------------------------------------------------------------------- The entirety of the information documented in the History of Present Illness, Review of Systems and Physical Exam were personally obtained by me. Portions of this information were initially documented by Irving BurtonEmily Ratchford, CMA and reviewed by me for thoroughness and accuracy.    Erasmo DownerBacigalupo, Keng Jewel M, MD, MPH [facility] 12/12/2017 5:00 PM

## 2017-12-13 ENCOUNTER — Telehealth: Payer: Self-pay | Admitting: Family Medicine

## 2017-12-13 NOTE — Telephone Encounter (Signed)
Pt is returning call.  WU#[phone]/NFCB#[phone]/MW

## 2017-12-13 NOTE — Progress Notes (Signed)
Advised  ED 

## 2018-01-14 ENCOUNTER — Ambulatory Visit: Payer: Self-pay | Admitting: Family Medicine

## 2018-11-27 ENCOUNTER — Encounter: Payer: Self-pay | Admitting: Family Medicine

## 2018-11-27 ENCOUNTER — Ambulatory Visit: Payer: BLUE CROSS/BLUE SHIELD | Admitting: Family Medicine

## 2018-11-27 VITALS — BP 118/68 | HR 76 | Temp 97.5°F | Wt 184.6 lb

## 2018-11-27 DIAGNOSIS — K5900 Constipation, unspecified: Secondary | ICD-10-CM

## 2018-11-27 DIAGNOSIS — R42 Dizziness and giddiness: Secondary | ICD-10-CM

## 2018-11-27 NOTE — Progress Notes (Signed)
Patient: Tiffany ElkChelsea Cammarata Female    DOB: 06/04/1991   27 y.o.   MRN: [id number] Visit Date: 11/27/2018  Today's Provider: Shirlee LatchAngela Arvil Utz, MD   Chief Complaint  Patient presents with  . Abdominal Pain   Subjective:    Abdominal Pain  This is a new problem. Episode onset: 1 month ago. The pain is located in the LLQ.  Patient states the problem gets worst after she eats a meal sometimes. She admits to longstanding constipation with irregular and hard bowel movements.  She has blood on toilet paper after BMs.  She took miralax in the past which helped.  Not taking any medicines for constipation recently.  She will fluctuate between loose and hard stools recently.  She is concerned because father had a complicated appendicitis and mother had abnormal colon polyps at age 28  She also occasionally has dizziness when standing quickly from sitting.  She admits that she does not stay hydrated     Allergies  Allergen Reactions  . Penicillins Rash     Current Outpatient Medications:  .  citalopram (CELEXA) 20 MG tablet, Take 1 tablet (20 mg total) by mouth daily., Disp: 30 tablet, Rfl: 3 .  docusate sodium (COLACE) 250 MG capsule, Take 250 mg by mouth daily., Disp: , Rfl:  .  loratadine (CLARITIN) 10 MG tablet, Take 10 mg by mouth daily., Disp: , Rfl:  .  polyethylene glycol (MIRALAX / GLYCOLAX) packet, Take 17 g by mouth daily., Disp: , Rfl:  .  SUMAtriptan (IMITREX) 50 MG tablet, Take 1 tablet (50 mg total) by mouth every 2 (two) hours as needed for migraine. May repeat in 2 hours if headache persists or recurs., Disp: 10 tablet, Rfl: 1  Current Facility-Administered Medications:  .  Levonorgestrel IUD 1 Device, 1 Device, Intrauterine, Once, Farrel ConnersGutierrez, Colleen, CNM  Review of Systems  Constitutional: Positive for fatigue.  Eyes: Negative.   Respiratory: Positive for shortness of breath.   Cardiovascular: Negative.   Gastrointestinal: Positive for abdominal pain.    Musculoskeletal: Negative.   Neurological: Positive for light-headedness.    Social History   Tobacco Use  . Smoking status: Former Smoker    Types: Cigarettes  . Smokeless tobacco: Never Used  . Tobacco comment: 1-2 cigarettes per week  Substance Use Topics  . Alcohol use: Yes    Alcohol/week: 1.0 standard drinks    Types: 1 Standard drinks or equivalent per week    Comment: 1/week   Objective:   BP 118/68 (BP Location: Right Arm, Patient Position: Sitting, Cuff Size: Normal)   Pulse 76   Temp (!) 97.5 F (36.4 C) (Oral)   Wt 184 lb 9.6 oz (83.7 kg)   SpO2 99%   BMI 36.05 kg/m  Vitals:   11/27/18 1034  BP: 118/68  Pulse: 76  Temp: (!) 97.5 F (36.4 C)  TempSrc: Oral  SpO2: 99%  Weight: 184 lb 9.6 oz (83.7 kg)     Physical Exam  Constitutional: She is oriented to person, place, and time. She appears well-developed and well-nourished. She does not appear ill. No distress.  HENT:  Head: Normocephalic and atraumatic.  Mouth/Throat: Oropharynx is clear and moist. No oropharyngeal exudate.  Eyes: Pupils are equal, round, and reactive to light. No scleral icterus.  Cardiovascular: Normal rate, regular rhythm, normal heart sounds and intact distal pulses.  Pulmonary/Chest: Effort normal and breath sounds normal. No respiratory distress. She has no wheezes. She has no rales.  Abdominal: Soft.  Normal appearance and bowel sounds are normal. She exhibits no distension. There is no hepatosplenomegaly. There is tenderness in the suprapubic area and left lower quadrant. There is no rigidity, no rebound, no guarding, no CVA tenderness, no tenderness at McBurney's point and negative Murphy's sign.  Neurological: She is alert and oriented to person, place, and time.  Skin: Skin is warm and dry. Capillary refill takes less than 2 seconds. No rash noted.  Psychiatric: She has a normal mood and affect. Her behavior is normal.  Vitals reviewed.      Assessment & Plan:   1.  Constipation, unspecified constipation type - longstanding issue - discussed that abd pain is related to constipation - no signs of obstruction, apeendicitis - discussed stool softeners and miralax cleanout - discussed that she should continue daily miralax after cleanout and titrate to one soft BM daily  2. Dizziness - likely orthostasis related to dehydration - will hydrate - discussed return precautions   Return if symptoms worsen or fail to improve.   Approximately 25 minutes was spent in discussion of which greater than 50% was consultation.    The entirety of the information documented in the History of Present Illness, Review of Systems and Physical Exam were personally obtained by me. Portions of this information were initially documented by Presley Raddle, CMA and reviewed by me for thoroughness and accuracy.    Erasmo Downer, MD, MPH [facility] 11/27/2018 1:11 PM

## 2018-11-27 NOTE — Patient Instructions (Signed)
Start colace (docusate) 2 pills each night at bedtime Do miralax cleanout: Buy medium miralax and 2 medium gatorades.  Half bottle of miralax in each gatorade and drink both over several hours.  For maintenance: find a miralax regimen that you do daily that gives 1-2 soft Bowel movements daily  Constipation, Adult Constipation is when a person has fewer bowel movements in a week than normal, has difficulty having a bowel movement, or has stools that are dry, hard, or larger than normal. Constipation may be caused by an underlying condition. It may become worse with age if a person takes certain medicines and does not take in enough fluids. Follow these instructions at home: Eating and drinking   Eat foods that have a lot of fiber, such as fresh fruits and vegetables, whole grains, and beans.  Limit foods that are high in fat, low in fiber, or overly processed, such as french fries, hamburgers, cookies, candies, and soda.  Drink enough fluid to keep your urine clear or pale yellow. General instructions  Exercise regularly or as told by your health care provider.  Go to the restroom when you have the urge to go. Do not hold it in.  Take over-the-counter and prescription medicines only as told by your health care provider. These include any fiber supplements.  Practice pelvic floor retraining exercises, such as deep breathing while relaxing the lower abdomen and pelvic floor relaxation during bowel movements.  Watch your condition for any changes.  Keep all follow-up visits as told by your health care provider. This is important. Contact a health care provider if:  You have pain that gets worse.  You have a fever.  You do not have a bowel movement after 4 days.  You vomit.  You are not hungry.  You lose weight.  You are bleeding from the anus.  You have thin, pencil-like stools. Get help right away if:  You have a fever and your symptoms suddenly get worse.  You leak  stool or have blood in your stool.  Your abdomen is bloated.  You have severe pain in your abdomen.  You feel dizzy or you faint. This information is not intended to replace advice given to you by your health care provider. Make sure you discuss any questions you have with your health care provider. Document Released: 09/08/2004 Document Revised: 06/30/2016 Document Reviewed: 05/31/2016 Elsevier Interactive Patient Education  2018 [employer]

## 2019-04-17 ENCOUNTER — Telehealth: Payer: Self-pay

## 2019-04-17 NOTE — Telephone Encounter (Signed)
LVM-PHQ-2 screening  

## 2019-07-27 ENCOUNTER — Encounter: Payer: Self-pay | Admitting: Family Medicine

## 2019-07-28 ENCOUNTER — Telehealth: Payer: Self-pay

## 2019-07-28 NOTE — Telephone Encounter (Signed)
Patient advised as below.  

## 2019-07-28 NOTE — Telephone Encounter (Signed)
LMTCB. Patient needs a OV for evaluation for abdominal pain per Dr. Brita Romp.

## 2019-07-29 ENCOUNTER — Ambulatory Visit: Payer: BLUE CROSS/BLUE SHIELD | Admitting: Family Medicine

## 2019-07-29 NOTE — Progress Notes (Deleted)
       Patient: Tiffany [name] Female    DOB: 1991/04/30   28 y.o.   MRN: [id number] Visit Date: 07/29/2019  Today's Provider: Lavon Paganini, MD   No chief complaint on file.  Subjective:     Abdominal Pain    Allergies  Allergen Reactions  . Penicillins Rash     Current Outpatient Medications:  .  citalopram (CELEXA) 20 MG tablet, Take 1 tablet (20 mg total) by mouth daily., Disp: 30 tablet, Rfl: 3 .  docusate sodium (COLACE) 250 MG capsule, Take 250 mg by mouth daily., Disp: , Rfl:  .  loratadine (CLARITIN) 10 MG tablet, Take 10 mg by mouth daily., Disp: , Rfl:  .  polyethylene glycol (MIRALAX / GLYCOLAX) packet, Take 17 g by mouth daily., Disp: , Rfl:  .  SUMAtriptan (IMITREX) 50 MG tablet, Take 1 tablet (50 mg total) by mouth every 2 (two) hours as needed for migraine. May repeat in 2 hours if headache persists or recurs., Disp: 10 tablet, Rfl: 1  Current Facility-Administered Medications:  .  Levonorgestrel IUD 1 Device, 1 Device, Intrauterine, Once, Dalia Heading, CNM  Review of Systems  Constitutional: Negative.   Respiratory: Negative.   Cardiovascular: Negative.   Gastrointestinal: Positive for abdominal pain.  Musculoskeletal: Negative.     Social History   Tobacco Use  . Smoking status: Former Smoker    Types: Cigarettes  . Smokeless tobacco: Never Used  . Tobacco comment: 1-2 cigarettes per week  Substance Use Topics  . Alcohol use: Yes    Alcohol/week: 1.0 standard drinks    Types: 1 Standard drinks or equivalent per week    Comment: 1/week      Objective:   There were no vitals taken for this visit. There were no vitals filed for this visit.   Physical Exam   No results found for any visits on 07/29/19.     Assessment & Plan        Lavon Paganini, MD  [facility]

## 2019-07-30 ENCOUNTER — Encounter: Payer: Self-pay | Admitting: Family Medicine

## 2019-07-30 ENCOUNTER — Other Ambulatory Visit: Payer: Self-pay

## 2019-07-30 ENCOUNTER — Ambulatory Visit (INDEPENDENT_AMBULATORY_CARE_PROVIDER_SITE_OTHER): Payer: BLUE CROSS/BLUE SHIELD | Admitting: Family Medicine

## 2019-07-30 VITALS — BP 115/67 | HR 85 | Temp 98.0°F | Wt 184.6 lb

## 2019-07-30 DIAGNOSIS — R1012 Left upper quadrant pain: Secondary | ICD-10-CM

## 2019-07-30 DIAGNOSIS — R8290 Unspecified abnormal findings in urine: Secondary | ICD-10-CM | POA: Diagnosis not present

## 2019-07-30 DIAGNOSIS — R630 Anorexia: Secondary | ICD-10-CM

## 2019-07-30 DIAGNOSIS — K5900 Constipation, unspecified: Secondary | ICD-10-CM

## 2019-07-30 DIAGNOSIS — R109 Unspecified abdominal pain: Secondary | ICD-10-CM

## 2019-07-30 LAB — POCT URINALYSIS DIPSTICK
Bilirubin, UA: NEGATIVE
Blood, UA: NEGATIVE
Glucose, UA: NEGATIVE
Ketones, UA: NEGATIVE
Leukocytes, UA: NEGATIVE
Nitrite, UA: NEGATIVE
Protein, UA: NEGATIVE
Spec Grav, UA: 1.025 (ref 1.010–1.025)
Urobilinogen, UA: 0.2 E.U./dL
pH, UA: 6 (ref 5.0–8.0)

## 2019-07-30 MED ORDER — CYCLOBENZAPRINE HCL 5 MG PO TABS: 5.0000 mg | ORAL_TABLET | Freq: Three times a day (TID) | ORAL | 1 refills | Status: AC | PRN

## 2019-07-30 NOTE — Progress Notes (Signed)
Patient: Tiffany [name] Female    DOB: Mar 11, 1991   28 y.o.   MRN: [id number] Visit Date: 07/30/2019  Today's Provider: Lavon Paganini, MD   Chief Complaint  Patient presents with  . Abdominal Pain   Subjective:     Abdominal Pain This is a recurrent problem. The current episode started more than 1 year ago. The onset quality is gradual. The problem occurs constantly. The problem has been gradually worsening. The pain is located in the left flank. The quality of the pain is aching and sharp. Associated symptoms include nausea. Associated symptoms comments: Loss of appetite. The pain is aggravated by eating and certain positions. Treatments tried: increased fluid intake and change in diet. The treatment provided no relief.   LUQ and L flank pain Worse with palpation Sometimes worse after eating Worse with bending to weedeat yesterday and lifting things loss of appetite 3 weeks Dull ache in quality but has sharp pain with bending sometimes  Completed miralax cleanout Trying to drink more water (seltzer) and less soda for last few month Taking stool softener daily  No bleeding or straining BMs are irregular - sometimes loose, sometimes hard  Foul odor and cloudiness in urine Doesn't seem to relate to flank pain Has some spotting, likely related to IUD Had negative Abd XRay previously   Allergies  Allergen Reactions  . Penicillins Rash     Current Outpatient Medications:  .  citalopram (CELEXA) 20 MG tablet, Take 1 tablet (20 mg total) by mouth daily., Disp: 30 tablet, Rfl: 3 .  docusate sodium (COLACE) 250 MG capsule, Take 250 mg by mouth daily., Disp: , Rfl:  .  loratadine (CLARITIN) 10 MG tablet, Take 10 mg by mouth daily., Disp: , Rfl:  .  polyethylene glycol (MIRALAX / GLYCOLAX) packet, Take 17 g by mouth daily., Disp: , Rfl:  .  SUMAtriptan (IMITREX) 50 MG tablet, Take 1 tablet (50 mg total) by mouth every 2 (two) hours as needed for migraine. May repeat in 2  hours if headache persists or recurs., Disp: 10 tablet, Rfl: 1  Current Facility-Administered Medications:  .  Levonorgestrel IUD 1 Device, 1 Device, Intrauterine, Once, Dalia Heading, CNM  Review of Systems  Constitutional: Positive for appetite change.  Respiratory: Negative.   Cardiovascular: Negative.   Gastrointestinal: Positive for abdominal pain and nausea.  Musculoskeletal: Negative.     Social History   Tobacco Use  . Smoking status: Former Smoker    Types: Cigarettes  . Smokeless tobacco: Never Used  . Tobacco comment: 1-2 cigarettes per week  Substance Use Topics  . Alcohol use: Yes    Alcohol/week: 1.0 standard drinks    Types: 1 Standard drinks or equivalent per week    Comment: 1/week      Objective:   BP 115/67 (BP Location: Right Arm, Patient Position: Sitting, Cuff Size: Large)   Pulse 85   Temp 98 F (36.7 C) (Oral)   Wt 184 lb 9.6 oz (83.7 kg)   SpO2 97%   BMI 36.05 kg/m  Vitals:   07/30/19 1431  BP: 115/67  Pulse: 85  Temp: 98 F (36.7 C)  TempSrc: Oral  SpO2: 97%  Weight: 184 lb 9.6 oz (83.7 kg)     Physical Exam Vitals signs reviewed.  Constitutional:      General: She is not in acute distress.    Appearance: She is well-developed.  HENT:     Head: Normocephalic and atraumatic.  Cardiovascular:  Rate and Rhythm: Normal rate and regular rhythm.     Heart sounds: Normal heart sounds. No murmur.  Pulmonary:     Effort: Pulmonary effort is normal. No respiratory distress.     Breath sounds: Normal breath sounds. No wheezing.  Abdominal:     General: Bowel sounds are normal. There is no distension.     Palpations: Abdomen is soft. There is no fluid wave, hepatomegaly, splenomegaly or mass.     Tenderness: There is abdominal tenderness in the left upper quadrant and left lower quadrant. There is left CVA tenderness. There is no guarding or rebound. Negative signs include Murphy's sign and McBurney's sign.     Hernia: No hernia  is present.  Skin:    General: Skin is warm and dry.     Capillary Refill: Capillary refill takes less than 2 seconds.     Findings: No rash.  Neurological:     Mental Status: She is alert and oriented to person, place, and time.  Psychiatric:        Mood and Affect: Mood normal.        Behavior: Behavior normal.      No results found for any visits on 07/30/19.     Assessment & Plan   1. Left upper quadrant pain 2. Left flank pain - L flank/LUQ TTP and constant pain - constipation is improved, but pain has worsened - L flank pain is new and may be related to MSK pain from bending and weedeating - UA negative, but will send culture as below - doubt pyelo or nephrolithiasis - will check CMP, CBC - unclear etiology and persistence and worsening, so discussed imaging options -Since she has had a normal abdominal x-ray previously, we will go ahead with CT abdomen pelvis as there is no clear etiology for this at this point - Comprehensive metabolic panel - CBC w/Diff/Platelet - POCT Urinalysis Dipstick - Urine Culture - CT Abdomen Pelvis Wo Contrast; Future  3. Constipation, unspecified constipation type -Improved -Continue stool softener and MiraLAX -Check TSH - TSH  4. Malodorous urine -UA without signs of infection, but will send urine culture to definitively rule this out - No need for renal imaging at this point as I doubt pyelonephritis or nephrolithiasis - POCT Urinalysis Dipstick - Urine Culture  5. Loss of appetite - new problem - concerning in the setting of abd pain without clear etiology - Further evaluation as above    Meds ordered this encounter  Medications  . cyclobenzaprine (FLEXERIL) 5 MG tablet    Sig: Take 1 tablet (5 mg total) by mouth 3 (three) times daily as needed for muscle spasms.    Dispense:  30 tablet    Refill:  1     Return if symptoms worsen or fail to improve.   The entirety of the information documented in the History of  Present Illness, Review of Systems and Physical Exam were personally obtained by me. Portions of this information were initially documented by Presley RaddleNikki Walston, CMA and reviewed by me for thoroughness and accuracy.    , Marzella SchleinAngela M, MD MPH [facility]

## 2019-07-30 NOTE — Patient Instructions (Signed)
Flank Pain, Adult Flank pain is pain that is located on the side of the body between the upper abdomen and the back. This area is called the flank. The pain may occur over a short period of time (acute), or it may be long-term or recurring (chronic). It may be mild or severe. Flank pain can be caused by many things, including:  Muscle soreness or injury.  Kidney stones or kidney disease.  Stress.  A disease of the spine (vertebral disk disease).  A lung infection (pneumonia).  Fluid around the lungs (pulmonary edema).  A skin rash caused by the chickenpox virus (shingles).  Tumors that affect the back of the abdomen.  Gallbladder disease. Follow these instructions at home:   Drink enough fluid to keep your urine clear or pale yellow.  Rest as told by your health care provider.  Take over-the-counter and prescription medicines only as told by your health care provider.  Keep a journal to track what has caused your flank pain and what has made it feel better.  Keep all follow-up visits as told by your health care provider. This is important. Contact a health care provider if:  Your pain is not controlled with medicine.  You have new symptoms.  Your pain gets worse.  You have a fever.  Your symptoms last longer than 2-3 days.  You have trouble urinating or you are urinating very frequently. Get help right away if:  You have trouble breathing or you are short of breath.  Your abdomen hurts or it is swollen or red.  You have nausea or vomiting.  You feel faint or you pass out.  You have blood in your urine. Summary  Flank pain is pain that is located on the side of the body between the upper abdomen and the back.  The pain may occur over a short period of time (acute), or it may be long-term or recurring (chronic). It may be mild or severe.  Flank pain can be caused by many things.  Contact your health care provider if your symptoms get worse or they last  longer than 2-3 days. This information is not intended to replace advice given to you by your health care provider. Make sure you discuss any questions you have with your health care provider. Document Released: 02/01/2006 Document Revised: 11/23/2017 Document Reviewed: 02/23/2017 Elsevier Patient Education  2020 Elsevier Inc.  

## 2019-07-31 ENCOUNTER — Telehealth: Payer: Self-pay | Admitting: Family Medicine

## 2019-07-31 LAB — CBC WITH DIFFERENTIAL/PLATELET
Basophils Absolute: 0 10*3/uL (ref 0.0–0.2)
Basos: 1 %
EOS (ABSOLUTE): 0.1 10*3/uL (ref 0.0–0.4)
Eos: 2 %
Hematocrit: 40.4 % (ref 34.0–46.6)
Hemoglobin: 13.7 g/dL (ref 11.1–15.9)
Immature Grans (Abs): 0 10*3/uL (ref 0.0–0.1)
Immature Granulocytes: 0 %
Lymphocytes Absolute: 1.5 10*3/uL (ref 0.7–3.1)
Lymphs: 23 %
MCH: 29.5 pg (ref 26.6–33.0)
MCHC: 33.9 g/dL (ref 31.5–35.7)
MCV: 87 fL (ref 79–97)
Monocytes Absolute: 0.5 10*3/uL (ref 0.1–0.9)
Monocytes: 8 %
Neutrophils Absolute: 4.3 10*3/uL (ref 1.4–7.0)
Neutrophils: 66 %
Platelets: 225 10*3/uL (ref 150–450)
RBC: 4.64 x10E6/uL (ref 3.77–5.28)
RDW: 12.4 % (ref 11.7–15.4)
WBC: 6.5 10*3/uL (ref 3.4–10.8)

## 2019-07-31 LAB — COMPREHENSIVE METABOLIC PANEL
ALT: 16 IU/L (ref 0–32)
AST: 17 IU/L (ref 0–40)
Albumin/Globulin Ratio: 2 (ref 1.2–2.2)
Albumin: 4.5 g/dL (ref 3.9–5.0)
Alkaline Phosphatase: 65 IU/L (ref 39–117)
BUN/Creatinine Ratio: 17 (ref 9–23)
BUN: 11 mg/dL (ref 6–20)
Bilirubin Total: 0.3 mg/dL (ref 0.0–1.2)
CO2: 20 mmol/L (ref 20–29)
Calcium: 9.4 mg/dL (ref 8.7–10.2)
Chloride: 105 mmol/L (ref 96–106)
Creatinine, Ser: 0.64 mg/dL (ref 0.57–1.00)
GFR calc Af Amer: 139 mL/min/{1.73_m2} (ref >59)
GFR calc non Af Amer: 121 mL/min/{1.73_m2} (ref >59)
Globulin, Total: 2.3 g/dL (ref 1.5–4.5)
Glucose: 82 mg/dL (ref 65–99)
Potassium: 4 mmol/L (ref 3.5–5.2)
Sodium: 142 mmol/L (ref 134–144)
Total Protein: 6.8 g/dL (ref 6.0–8.5)

## 2019-07-31 LAB — TSH: TSH: 0.598 u[IU]/mL (ref 0.450–4.500)

## 2019-07-31 NOTE — Telephone Encounter (Signed)
Spoke to Peer to peer service.  Pre-auth Number - [id number]  Valid: 07/31/2019-01/26/2020

## 2019-07-31 NOTE — Telephone Encounter (Signed)
Pt's insurance company would like to speak peer to peer to get CT approved.Phone [phone]. Member # [id number]

## 2019-08-01 LAB — URINE CULTURE: Organism ID, Bacteria: NO GROWTH

## 2019-08-04 ENCOUNTER — Telehealth: Payer: Self-pay

## 2019-08-04 NOTE — Telephone Encounter (Signed)
Order signed.

## 2019-08-04 NOTE — Telephone Encounter (Signed)
Radiology called to let you know they changed the orders in the system to a CT scan with contrast. They just need for you to sign the new order. Please review.

## 2019-08-08 ENCOUNTER — Other Ambulatory Visit: Payer: Self-pay

## 2019-08-08 ENCOUNTER — Ambulatory Visit
Admission: RE | Admit: 2019-08-08 | Discharge: 2019-08-08 | Disposition: A | Discharge status: Home / Self Care | Payer: BLUE CROSS/BLUE SHIELD | Source: Ambulatory Visit | Attending: Family Medicine | Admitting: Family Medicine

## 2019-08-08 DIAGNOSIS — R1012 Left upper quadrant pain: Secondary | ICD-10-CM | POA: Diagnosis present

## 2019-08-08 DIAGNOSIS — R109 Unspecified abdominal pain: Secondary | ICD-10-CM | POA: Insufficient documentation

## 2019-08-08 MED ORDER — IOHEXOL 300 MG/ML  SOLN
100.0000 mL | Freq: Once | INTRAMUSCULAR | Status: AC | PRN
  Administered 2019-08-08: 100 mL via INTRAVENOUS

## 2019-08-11 ENCOUNTER — Telehealth: Payer: Self-pay | Admitting: *Deleted

## 2019-08-11 NOTE — Telephone Encounter (Signed)
-----   Message from Virginia Crews, MD sent at 08/11/2019  8:55 AM EDT ----- Normal CT, with the exception of a small amount of free fluid in the pelvis.  This is commonly seen after an ovarian cyst.  Ovarian cyst could have been the reason for pain, but the free fluid is not.  No findings to explain abdominal pain

## 2019-08-11 NOTE — Telephone Encounter (Signed)
LMOVM for pt to return call 

## 2019-08-12 NOTE — Telephone Encounter (Signed)
lmtcb

## 2019-08-14 NOTE — Telephone Encounter (Signed)
lmtcb

## 2019-08-15 NOTE — Telephone Encounter (Signed)
Viewed by Ronney Lion on 08/11/2019 9:28 AM on mychart.

## 2019-08-18 ENCOUNTER — Ambulatory Visit (INDEPENDENT_AMBULATORY_CARE_PROVIDER_SITE_OTHER): Payer: BLUE CROSS/BLUE SHIELD | Admitting: Certified Nurse Midwife

## 2019-08-18 ENCOUNTER — Encounter: Payer: Self-pay | Admitting: Certified Nurse Midwife

## 2019-08-18 ENCOUNTER — Ambulatory Visit: Payer: BLUE CROSS/BLUE SHIELD | Admitting: Certified Nurse Midwife

## 2019-08-18 ENCOUNTER — Other Ambulatory Visit: Payer: Self-pay

## 2019-08-18 VITALS — BP 100/70 | HR 72 | Ht 60.0 in | Wt 180.4 lb

## 2019-08-18 DIAGNOSIS — Z124 Encounter for screening for malignant neoplasm of cervix: Secondary | ICD-10-CM | POA: Diagnosis not present

## 2019-08-18 DIAGNOSIS — R102 Pelvic and perineal pain: Secondary | ICD-10-CM

## 2019-08-18 DIAGNOSIS — Z01419 Encounter for gynecological examination (general) (routine) without abnormal findings: Secondary | ICD-10-CM

## 2019-08-18 DIAGNOSIS — Z30432 Encounter for removal of intrauterine contraceptive device: Secondary | ICD-10-CM

## 2019-08-18 DIAGNOSIS — Z30011 Encounter for initial prescription of contraceptive pills: Secondary | ICD-10-CM

## 2019-08-18 MED ORDER — NORETHINDRONE 0.35 MG PO TABS: 1.0000 | ORAL_TABLET | Freq: Every day | ORAL | 11 refills | Status: DC

## 2019-08-18 NOTE — Progress Notes (Signed)
Gynecology Annual Exam  PCP: Erasmo DownerBacigalupo, Angela M, MD  Chief Complaint:  Chief Complaint  Patient presents with  . Gynecologic Exam    taking out IUD; chronic left side pain - CT fluid in pelvis consistent c ovarian cyst    History of Present Illness: Tiffany [name] is a 28 y.o. G0, lesbian female who presents for her annual exam and for an IUD removal.. She had a Kyleena IUD inserted 2 years ago for treatment of her heavy painful menses. The IUD did succeed in reducing the flow, but she reports frequent episodes of bleeding/spotting. Has been having stabbing pains in pelvis, that last seconds to a minute that is sometimes related to the bleeding and sometimes can occur even when not bleeding. She also reports undergoing an evaluation for left sided abdominal pain (mid left side) since Christmas. Initially the pain lessened when she was treated for constipation, but restarted 3-4 months ago. She has made dietary changes (decreased glutin and dairy), but that has not lessened this pain. A CT scan recently was negative. There was a small amount of fluid in the pelvis. She has also had weight gain and and mood swings with the IUD.   LMP: Patient's last menstrual period was 08/18/2019.Marland Kitchen.  Her last Pap and pelvic exam was 05/07/2017. Last Pap was NIL. Her medical history is significant for migraine with aura and allergic rhinitis. The patient is sexually active with her female partner.  There is no notable family history of breast or ovarian cancer in her family. She does do occasional self breast exams The patient has quit smoking.  She drinks 1 glass of alcohol/week. The patient has regular exercise: no, but she gets over 10, 000 steps/day. BMI=35.23 kg/m2 (Has gained 18# in 2 years) She does get adequate calcium in her diet. She has not had recent cholesterol screening.  The patient denies current symptoms of depression.    Review of Systems: Review of Systems  Constitutional: Positive  for malaise/fatigue. Negative for chills, fever and weight loss.  HENT: Negative for congestion, sinus pain and sore throat.   Eyes: Negative for blurred vision and pain.  Respiratory: Negative for hemoptysis, shortness of breath and wheezing.   Cardiovascular: Negative for chest pain, palpitations and leg swelling.  Gastrointestinal: Positive for abdominal pain. Negative for blood in stool, diarrhea, heartburn, nausea and vomiting.  Genitourinary: Negative for dysuria, frequency, hematuria and urgency.  Musculoskeletal: Negative for back pain, joint pain and myalgias.  Skin: Negative for itching and rash.  Neurological: Positive for headaches (migraines 1-3/month). Negative for dizziness and tingling.  Endo/Heme/Allergies: Positive for environmental allergies. Negative for polydipsia. Does not bruise/bleed easily.       Negative for hirsutism   Psychiatric/Behavioral: Negative for depression. The patient is nervous/anxious. The patient does not have insomnia.     Past Medical History:  Past Medical History:  Diagnosis Date  . Allergic rhinitis   . Allergy   . Anxiety   . Heart murmur   . Migraine with aura   . Mononucleosis    28 yo    Past Surgical History:  Past Surgical History:  Procedure Laterality Date  . WISDOM TOOTH EXTRACTION      Family History:  Family History  Problem Relation Age of Onset  . Clotting disorder Mother        has had frequent DVTs  . Colon polyps Mother        was told they were cancerous  . Healthy Father   .  Healthy Sister   . Migraines Maternal Grandmother   . Healthy Sister   . Pancreatic cancer Cousin        second cousin  . Breast cancer Neg Hx   . Ovarian cancer Neg Hx     Social History:  Social History   Socioeconomic History  . Marital status: Married    Spouse name: [name]  . Number of children: 0  . Years of education: 7416  . Highest education level: Some college, no degree  Occupational History  . Occupation:  Leisure centre managerBartender  Social Needs  . Financial resource strain: Not hard at all  . Food insecurity    Worry: Never true    Inability: Never true  . Transportation needs    Medical: No    Non-medical: No  Tobacco Use  . Smoking status: Former Smoker    Types: Cigarettes  . Smokeless tobacco: Never Used  . Tobacco comment: 1-2 cigarettes per week  Substance and Sexual Activity  . Alcohol use: Yes    Alcohol/week: 1.0 standard drinks    Types: 1 Standard drinks or equivalent per week    Comment: 1/week  . Drug use: No  . Sexual activity: Yes    Partners: Female    Birth control/protection: I.U.D.  Lifestyle  . Physical activity    Days per week: Not on file    Minutes per session: Not on file  . Stress: Not on file  Relationships  . Social Musicianconnections    Talks on phone: Not on file    Gets together: Not on file    Attends religious service: Not on file    Active member of club or organization: Not on file    Attends meetings of clubs or organizations: Not on file    Relationship status: Not on file  . Intimate partner violence    Fear of current or ex partner: No    Emotionally abused: No    Physically abused: No    Forced sexual activity: No  Other Topics Concern  . Not on file  Social History Narrative  . Not on file    Allergies:  Allergies  Allergen Reactions  . Penicillins Rash    Medications:  Current Outpatient Medications on File Prior to Visit  Medication Sig Dispense Refill  . docusate sodium (COLACE) 250 MG capsule Take 250 mg by mouth daily.    Marland Kitchen. loratadine (CLARITIN) 10 MG tablet Take 10 mg by mouth daily.    . citalopram (CELEXA) 20 MG tablet Take 1 tablet (20 mg total) by mouth daily. (Patient not taking: Reported on 08/18/2019) 30 tablet 3  . cyclobenzaprine (FLEXERIL) 5 MG tablet Take 1 tablet (5 mg total) by mouth 3 (three) times daily as needed for muscle spasms. (Patient not taking: Reported on 08/18/2019) 30 tablet 1  . polyethylene glycol (MIRALAX /  GLYCOLAX) packet Take 17 g by mouth daily.    . SUMAtriptan (IMITREX) 50 MG tablet Take 1 tablet (50 mg total) by mouth every 2 (two) hours as needed for migraine. May repeat in 2 hours if headache persists or recurs. 10 tablet 1   No current facility-administered medications on file prior to visit.      Physical Exam Vitals: BP 100/70   Pulse 72   Ht 5' (1.524 m)   Wt 180 lb 6.4 oz (81.8 kg)   LMP 08/18/2019   BMI 35.23 kg/m   General: WF in NAD HEENT: normocephalic, anicteric Thyroid: no enlargement, no palpable  nodules Pulmonary: No increased work of breathing, CTAB Cardiovascular: RRR without murmur Breast: Breast symmetrical, no tenderness, no palpable nodules or masses, no skin or nipple retraction present, no nipple discharge.  No axillary, infraclavicular,  or supraclavicular lymphadenopathy. Abdomen: soft, non-tender, non-distended.  Umbilicus without lesions.  No hepatomegaly or masses palpable. No evidence of hernia  Genitourinary:  External: Normal external female genitalia. ( larger right labia minora)  Normal urethral meatus, normal Bartholin's and Skene's glands.    Vagina: Normal vaginal mucosa, no evidence of prolapse, scant bloody mucous discharge    Cervix:  IUD string seen, posterior, pink   Uterus: RV, mobile, normal size and contour, NT  Adnexa: ovaries non-enlarged, no adnexal masses, NT  Rectal: deferred  Lymphatic: no evidence of inguinal lymphadenopathy Extremities: no edema, erythema, or tenderness Neurologic: Grossly intact Psychiatric: mood appropriate, affect full     Assessment: 27 y.o. G0P0000 with metrorrhagia and pelvic pain Left sided abdominal pain   Plan: 1) IUD removed easily intact per patient request. Discussed alternative treatment menorrhagia and dysmenorrhea including pills, Depo, Nexplanon.  Explained to patient that she would not be a good candidate for combined oral contraceptives due to her classic migraines. Advised she is at  risk for stroke with combined pills, but POP would be OK to take. Patient desires to try minipill. Discussed how to take and possible irregular bleeding. RTO if not tolerating pills  2) Pap was done  3) Routine healthcare maintenance including cholesterol, diabetes screening discussed and declined at this time   4) Follow up with PCp for further evaluation of left sided pain. May need GI referral.  5) RTO 1 year and prn. Dalia Heading, CNM

## 2019-08-19 ENCOUNTER — Ambulatory Visit: Payer: BLUE CROSS/BLUE SHIELD | Admitting: Family Medicine

## 2019-08-20 LAB — CYTOLOGY - PAP: Diagnosis: NEGATIVE

## 2020-01-15 ENCOUNTER — Encounter: Payer: Self-pay | Admitting: Family Medicine

## 2020-01-15 NOTE — Telephone Encounter (Signed)
Go ahead and schedule virtual visit. Can be this afternoon if she'd like

## 2020-01-16 ENCOUNTER — Telehealth (INDEPENDENT_AMBULATORY_CARE_PROVIDER_SITE_OTHER): Payer: BLUE CROSS/BLUE SHIELD | Admitting: Family Medicine

## 2020-01-16 DIAGNOSIS — R41840 Attention and concentration deficit: Secondary | ICD-10-CM | POA: Diagnosis not present

## 2020-01-16 DIAGNOSIS — F332 Major depressive disorder, recurrent severe without psychotic features: Secondary | ICD-10-CM

## 2020-01-16 DIAGNOSIS — F39 Unspecified mood [affective] disorder: Secondary | ICD-10-CM | POA: Diagnosis not present

## 2020-01-16 NOTE — Progress Notes (Signed)
Patient: Tiffany [name] Female    DOB: 20-Jan-1991   29 y.o.   MRN: [id number] Visit Date: 01/16/2020  Today's Provider: Shirlee Latch, MD   No chief complaint on file.  Subjective:    Virtual Visit via Video Note  I connected with Concepcion Elk on 01/16/20 at 10:20 AM EST by a video enabled telemedicine application and verified that I am speaking with the correct person using two identifiers.  Location:  Patient location: home Provider location: [facility] Persons involved in the visit: patient, provider   I discussed the limitations of evaluation and management by telemedicine and the availability of in person appointments. The patient expressed understanding and agreed to proceed.  HPI Patient requesting to be tested for Bipolar Depression and ADHD. Patient has noticed an increase in depression symptoms over the last 6-12 months. Patient notices loss in motivation, decrease in concentration. Would also like to discuss Celexa medication.  She has stopped this.  She did not find it all that helpful.  Started counseling in December.  Her marriage is suffering.  She is trying to be more self aware.  She is noticing more things that have become tendencies.    Sex drive is nonexistent at times and then will fluctuate to hypersexuality at times.  She has cycles where she has periods of pressured speech and excessive housecleaning.  She finds that she starts a lot of projects and then does not finish them.  She has trouble focusing on the task.   Allergies  Allergen Reactions  . Penicillins Rash     Current Outpatient Medications:  .  cyclobenzaprine (FLEXERIL) 5 MG tablet, Take 1 tablet (5 mg total) by mouth 3 (three) times daily as needed for muscle spasms., Disp: 30 tablet, Rfl: 1 .  docusate sodium (COLACE) 250 MG capsule, Take 250 mg by mouth daily., Disp: , Rfl:  .  loratadine (CLARITIN) 10 MG tablet, Take 10 mg by mouth daily., Disp: , Rfl:  .   polyethylene glycol (MIRALAX / GLYCOLAX) packet, Take 17 g by mouth daily., Disp: , Rfl:  .  SUMAtriptan (IMITREX) 50 MG tablet, Take 1 tablet (50 mg total) by mouth every 2 (two) hours as needed for migraine. May repeat in 2 hours if headache persists or recurs., Disp: 10 tablet, Rfl: 1 .  citalopram (CELEXA) 20 MG tablet, Take 1 tablet (20 mg total) by mouth daily. (Patient not taking: Reported on 08/18/2019), Disp: 30 tablet, Rfl: 3 .  norethindrone (MICRONOR) 0.35 MG tablet, Take 1 tablet (0.35 mg total) by mouth daily. (Patient not taking: Reported on 01/16/2020), Disp: 1 Package, Rfl: 11  Review of Systems  Constitutional: Negative.   HENT: Negative.   Respiratory: Negative.   Cardiovascular: Negative.   Genitourinary: Negative.   Musculoskeletal: Negative.   Skin: Negative.   Neurological: Negative.   Psychiatric/Behavioral: Positive for decreased concentration, dysphoric mood and sleep disturbance. Negative for hallucinations, self-injury and suicidal ideas. The patient is nervous/anxious and is hyperactive.     Social History   Tobacco Use  . Smoking status: Former Smoker    Types: Cigarettes  . Smokeless tobacco: Never Used  . Tobacco comment: 1-2 cigarettes per week  Substance Use Topics  . Alcohol use: Yes    Alcohol/week: 1.0 standard drinks    Types: 1 Standard drinks or equivalent per week    Comment: 1/week      Objective:   There were no vitals taken for this visit. There  were no vitals filed for this visit.There is no height or weight on file to calculate BMI.   Physical Exam Constitutional:      Appearance: Normal appearance.  Pulmonary:     Effort: Pulmonary effort is normal. No respiratory distress.  Neurological:     Mental Status: She is alert and oriented to person, place, and time. Mental status is at baseline.  Psychiatric:        Mood and Affect: Mood is depressed. Affect is tearful.        Speech: Speech normal.        Behavior: Behavior  normal.        Thought Content: Thought content does not include homicidal or suicidal ideation. Thought content does not include homicidal or suicidal plan.      No results found for any visits on 01/16/20.     Assessment & Plan    I discussed the assessment and treatment plan with the patient. The patient was provided an opportunity to ask questions and all were answered. The patient agreed with the plan and demonstrated an understanding of the instructions.   The patient was advised to call back or seek an in-person evaluation if the symptoms worsen or if the condition fails to improve as anticipated.  1. Severe episode of recurrent major depressive disorder, without psychotic features ([place]) 2. Mood disorder ([place]) 3. Concentration deficit - ongoing issue with depression - now with some possible manic symptoms and symptoms of cycling of mood, consistent with mood disorder -Concern for possible bipolar disorder -Patient also has some concerns about her concentration-we discussed that these could be related to possible mood disorder, but she also may have coexistent ADD -We discussed her Celexa, which she has already discontinued -She did not seem to have improvement in her depression when on Celexa -We also discussed that SSRIs can worsen manic symptoms in bipolar disorder, so while she is having her mood disorder worked up, we will hold off on starting new SSRI -Patient really needs to see a psychiatrist - so urgent referral placed today - contracted for safety - no SI/HI -Return precautions discussed - Ambulatory referral to Psychiatry   Follow-up as needed pending Psych eval   The entirety of the information documented in the History of Present Illness, Review of Systems and Physical Exam were personally obtained by me. Portions of this information were initially documented by Lynford Humphrey, CMA and reviewed by me for thoroughness and accuracy.    Bacigalupo, Dionne Bucy, MD MPH  [facility]

## 2020-09-30 ENCOUNTER — Encounter: Payer: Self-pay | Admitting: Family Medicine

## 2020-10-01 MED ORDER — SUMATRIPTAN SUCCINATE 50 MG PO TABS: 50.0000 mg | ORAL_TABLET | ORAL | 1 refills | Status: AC | PRN

## 2021-09-20 ENCOUNTER — Emergency Department (HOSPITAL_COMMUNITY): Payer: Worker's Compensation

## 2021-09-20 ENCOUNTER — Encounter (HOSPITAL_COMMUNITY): Payer: Self-pay | Admitting: *Deleted

## 2021-09-20 ENCOUNTER — Emergency Department (HOSPITAL_COMMUNITY)
Admission: EM | Admit: 2021-09-20 | Discharge: 2021-09-20 | Disposition: A | Discharge status: Home / Self Care | Payer: Worker's Compensation | Attending: Student | Admitting: Student

## 2021-09-20 DIAGNOSIS — W28XXXA Contact with powered lawn mower, initial encounter: Secondary | ICD-10-CM | POA: Diagnosis not present

## 2021-09-20 DIAGNOSIS — Z87891 Personal history of nicotine dependence: Secondary | ICD-10-CM | POA: Diagnosis not present

## 2021-09-20 DIAGNOSIS — Z23 Encounter for immunization: Secondary | ICD-10-CM | POA: Insufficient documentation

## 2021-09-20 DIAGNOSIS — S83104A Unspecified dislocation of right knee, initial encounter: Secondary | ICD-10-CM | POA: Insufficient documentation

## 2021-09-20 DIAGNOSIS — S8991XA Unspecified injury of right lower leg, initial encounter: Secondary | ICD-10-CM | POA: Diagnosis present

## 2021-09-20 MED ORDER — HYDROCODONE-ACETAMINOPHEN 5-325 MG PO TABS
1.0000 | ORAL_TABLET | Freq: Once | ORAL | Status: AC
  Administered 2021-09-20: 1 via ORAL
  Filled 2021-09-20: qty 1

## 2021-09-20 MED ORDER — PROPOFOL 10 MG/ML IV BOLUS
0.5000 mg/kg | Freq: Once | INTRAVENOUS | Status: AC
  Administered 2021-09-20: 42 mg via INTRAVENOUS
  Filled 2021-09-20: qty 20

## 2021-09-20 MED ORDER — HYDROCODONE-ACETAMINOPHEN 5-325 MG PO TABS: 1.0000 | ORAL_TABLET | Freq: Four times a day (QID) | ORAL | 0 refills | Status: DC | PRN

## 2021-09-20 MED ORDER — TETANUS-DIPHTHERIA TOXOIDS TD 5-2 LFU IM INJ
0.5000 mL | INJECTION | Freq: Once | INTRAMUSCULAR | Status: AC
  Administered 2021-09-20: 0.5 mL via INTRAMUSCULAR
  Filled 2021-09-20: qty 0.5

## 2021-09-20 MED ORDER — PROPOFOL 10 MG/ML IV BOLUS
INTRAVENOUS | Status: AC
  Filled 2021-09-20: qty 20

## 2021-09-20 MED ORDER — FENTANYL CITRATE PF 50 MCG/ML IJ SOSY
50.0000 ug | PREFILLED_SYRINGE | Freq: Once | INTRAMUSCULAR | Status: AC
  Administered 2021-09-20: 50 ug via INTRAVENOUS
  Filled 2021-09-20: qty 1

## 2021-09-20 MED ORDER — FENTANYL CITRATE PF 50 MCG/ML IJ SOSY
100.0000 ug | PREFILLED_SYRINGE | Freq: Once | INTRAMUSCULAR | Status: AC
  Administered 2021-09-20: 100 ug via INTRAVENOUS
  Filled 2021-09-20: qty 2

## 2021-09-20 MED ORDER — PHENYLEPHRINE 40 MCG/ML (10ML) SYRINGE FOR IV PUSH (FOR BLOOD PRESSURE SUPPORT)
80.0000 ug | PREFILLED_SYRINGE | Freq: Once | INTRAVENOUS | Status: DC | PRN
  Filled 2021-09-20: qty 10

## 2021-09-20 NOTE — Progress Notes (Signed)
Orthopedic Tech Progress Note Patient Details:  Tiffany [name] [birth year] [id number]  Ortho Devices Type of Ortho Device: Long leg splint Ortho Device/Splint Location: Right leg posterior Ortho Device/Splint Interventions: Ordered, Application, Adjustment   Post Interventions Patient Tolerated: Well Instructions Provided: Adjustment of device, Care of device Assisted MD with long leg posterior application following reduction of the knee.  Darleen Crocker 09/20/2021, 5:07 PM

## 2021-09-20 NOTE — Discharge Instructions (Addendum)
Elevate to help with swelling.  Take pain medication as needed.

## 2021-09-20 NOTE — ED Provider Notes (Addendum)
Tiffany [name]   CSN: [id number] Arrival date & time: 09/20/21  1435     History Chief Complaint  Patient presents with   Knee Pain    Tiffany [name] is a 30 y.o. female who presents the emergency department for evaluation of right knee pain.  Patient states that she was on a riding mower that fell off an elevated wall and rolled onto her right knee.  She had immediate pain and swelling of the right lower extremity and endorses mild paresthesias of the right lower extremity.  Pulses 2+ in the extremity on initial evaluation but she does have significant swelling and an obvious deformity to the right knee.  No additional traumatic complaints and no additional external signs of trauma.  Patient unsure of last tetanus dose.   Knee Pain Associated symptoms: no back pain and no fever       Past Medical History:  Diagnosis Date   Allergic rhinitis    Allergy    Anxiety    Heart murmur    Migraine with aura    Mononucleosis    30 yo    Patient Active Problem List   Diagnosis Date Noted   Left flank pain 07/30/2019   Migraine with aura    Allergic rhinitis    Anxiety    Dysmenorrhea 05/07/2017   Menorrhagia with regular cycle 05/07/2017    Past Surgical History:  Procedure Laterality Date   WISDOM TOOTH EXTRACTION       OB History     Gravida  0   Para  0   Term  0   Preterm  0   AB  0   Living  0      SAB  0   IAB  0   Ectopic  0   Multiple  0   Live Births  0           Family History  Problem Relation Age of Onset   Clotting disorder Mother        has had frequent DVTs   Colon polyps Mother        was told they were cancerous   Healthy Father    Healthy Sister    Migraines Maternal Grandmother    Healthy Sister    Pancreatic cancer Cousin        second cousin   Breast cancer Neg Hx    Ovarian cancer Neg Hx     Social History   Tobacco Use   Smoking status: Former    Types:  Cigarettes   Smokeless tobacco: Never   Tobacco comments:    1-2 cigarettes per week  Vaping Use   Vaping Use: Never used  Substance Use Topics   Alcohol use: Yes    Alcohol/week: 1.0 standard drink    Types: 1 Standard drinks or equivalent per week    Comment: 1/week   Drug use: No    Home Medications Prior to Admission medications   Medication Sig Start Date End Date Taking? Authorizing Provider  citalopram (CELEXA) 20 MG tablet Take 1 tablet (20 mg total) by mouth daily. Patient not taking: Reported on 08/18/2019 12/12/17   Erasmo Downer, MD  cyclobenzaprine (FLEXERIL) 5 MG tablet Take 1 tablet (5 mg total) by mouth 3 (three) times daily as needed for muscle spasms. 07/30/19   Erasmo Downer, MD  docusate sodium (COLACE) 250 MG capsule Take 250 mg by mouth daily.    [provider]  loratadine (CLARITIN) 10 MG tablet Take 10 mg by mouth daily.    [provider]  norethindrone (MICRONOR) 0.35 MG tablet Take 1 tablet (0.35 mg total) by mouth daily. Patient not taking: Reported on 01/16/2020 08/18/19   Farrel Conners, CNM  polyethylene glycol [facility] / Ethelene Hal) packet Take 17 g by mouth daily.    [provider]  SUMAtriptan (IMITREX) 50 MG tablet Take 1 tablet (50 mg total) by mouth every 2 (two) hours as needed for migraine. May repeat in 2 hours if headache persists or recurs. 10/01/20   Margaretann Loveless, PA-C    Allergies    Penicillins  Review of Systems   Review of Systems  Constitutional:  Negative for chills and fever.  HENT:  Negative for ear pain and sore throat.   Eyes:  Negative for pain and visual disturbance.  Respiratory:  Negative for cough and shortness of breath.   Cardiovascular:  Negative for chest pain and palpitations.  Gastrointestinal:  Negative for abdominal pain and vomiting.  Genitourinary:  Negative for dysuria and hematuria.  Musculoskeletal:  Positive for arthralgias (R knee pain and swelling). Negative  for back pain.  Skin:  Negative for color change and rash.  Neurological:  Negative for seizures and syncope.  All other systems reviewed and are negative.  Physical Exam Updated Vital Signs BP 118/86   Pulse 97   Temp 98 F (36.7 C) (Oral)   Resp 18   Ht 5\' 1"  (1.549 m)   Wt 83.9 kg   SpO2 100%   BMI 34.96 kg/m   Physical Exam Vitals and nursing [name] reviewed.  Constitutional:      General: She is not in acute distress.    Appearance: She is well-developed.  HENT:     Head: Normocephalic and atraumatic.  Eyes:     Conjunctiva/sclera: Conjunctivae normal.  Cardiovascular:     Rate and Rhythm: Normal rate and regular rhythm.     Heart sounds: No murmur heard. Pulmonary:     Effort: Pulmonary effort is normal. No respiratory distress.     Breath sounds: Normal breath sounds.  Abdominal:     Palpations: Abdomen is soft.     Tenderness: There is no abdominal tenderness.  Musculoskeletal:        General: Swelling, tenderness (R knee), deformity and signs of injury present.     Cervical back: Neck supple.  Skin:    General: Skin is warm and dry.  Neurological:     Mental Status: She is alert.    ED Results / Procedures / Treatments   Labs (all labs ordered are listed, but only abnormal results are displayed) Labs Reviewed - No data to display  EKG None  Radiology No results found.  Procedures .Ortho Injury Treatment  Date/Time: 09/20/2021 5:02 PM Performed by: 09/22/2021, MD Authorized by: Glendora Score, MD   Consent:    Consent obtained:  Verbal   Consent given by:  Patient   Risks discussed:  Fracture, nerve damage, irreducible dislocation and vascular damage   Alternatives discussed:  ImmobilizationInjury location: knee Location details: right knee Injury type: dislocation Dislocation type: lateral Pre-procedure neurovascular assessment: neurovascularly intact Pre-procedure distal perfusion: normal Pre-procedure neurological function:  normal Pre-procedure range of motion: reduced  Patient sedated: Yes. Refer to sedation procedure documentation for details of sedation. Manipulation performed: yes Reduction method: direct traction Reduction successful: yes X-ray confirmed reduction: yes Immobilization: splint Splint type: long leg Splint Applied by: Glendora Score  Supplies used: Ortho-Glass Post-procedure neurovascular assessment: post-procedure neurovascularly intact Post-procedure distal perfusion: normal Post-procedure neurological function: normal Post-procedure range of motion: improved     Medications Ordered in ED Medications  tetanus & diphtheria toxoids (adult) (TENIVAC) injection 0.5 mL (has no administration in time range)  fentaNYL (SUBLIMAZE) injection 100 mcg (100 mcg Intravenous Given 09/20/21 1459)    ED Course  I have reviewed the triage vital signs and the nursing notes.  Pertinent labs & imaging results that were available during my care of the patient were reviewed by me and considered in my medical decision making (see chart for details).    MDM Rules/Calculators/A&P                           Patient seen in the emergency department for evaluation of right knee pain after a lawnmower accident.  Physical exam reveals significant tenderness and swelling with an obvious deformity over the right knee.  Pulses intact at the dorsalis pedis and posterior tibialis.  X-ray showing fairly significant lateral knee dislocation with possible posterior tibial plateau fracture.  Orthopedics ([place] MD) was consulted and they recommended reduction with conscious sedation.  Conscious sedation performed with propofol and knee reduced at bedside and placed in a posterior long-leg splint.  Reduction confirmed by x-ray.  Orthopedics not recommending CT angiography and admission for pulse checks due to patient's significant at home resources with follow-up and relationship with our orthopedic department.  In addition,  patient had strong pulses prior to reduction and after reduction making the orthopedic team more comfortable with discharge from the ED today.  Orthopedics recommending MRI prior to discharge for outpatient surgical planning.  Patient received an MRI prior to leaving the emergency department for operative management and patient then discharged with outpatient orthopedic follow-up. Final Clinical Impression(s) / ED Diagnoses Final diagnoses:  None    Rx / DC Orders ED Discharge Orders     None        Yaretsi Humphres, MD 09/20/21 1705    Glendora Score, MD 09/21/21 1350

## 2021-09-20 NOTE — ED Provider Notes (Signed)
[facility]-EMERGENCY DEPT Provider Note   CSN: [id number] Arrival date & time: 09/20/21  1435     History Chief Complaint  Patient presents with   Knee Pain    Tiffany [name] is a 30 y.o. female.   Knee Pain     Past Medical History:  Diagnosis Date   Allergic rhinitis    Allergy    Anxiety    Heart murmur    Migraine with aura    Mononucleosis    30 yo    Patient Active Problem List   Diagnosis Date Noted   Left flank pain 07/30/2019   Migraine with aura    Allergic rhinitis    Anxiety    Dysmenorrhea 05/07/2017   Menorrhagia with regular cycle 05/07/2017    Past Surgical History:  Procedure Laterality Date   WISDOM TOOTH EXTRACTION       OB History     Gravida  0   Para  0   Term  0   Preterm  0   AB  0   Living  0      SAB  0   IAB  0   Ectopic  0   Multiple  0   Live Births  0           Family History  Problem Relation Age of Onset   Clotting disorder Mother        has had frequent DVTs   Colon polyps Mother        was told they were cancerous   Healthy Father    Healthy Sister    Migraines Maternal Grandmother    Healthy Sister    Pancreatic cancer Cousin        second cousin   Breast cancer Neg Hx    Ovarian cancer Neg Hx     Social History   Tobacco Use   Smoking status: Former    Types: Cigarettes   Smokeless tobacco: Never   Tobacco comments:    1-2 cigarettes per week  Vaping Use   Vaping Use: Never used  Substance Use Topics   Alcohol use: Yes    Alcohol/week: 1.0 standard drink    Types: 1 Standard drinks or equivalent per week    Comment: 1/week   Drug use: No    Home Medications Prior to Admission medications   Medication Sig Start Date End Date Taking? Authorizing Provider  citalopram (CELEXA) 20 MG tablet Take 1 tablet (20 mg total) by mouth daily. Patient not taking: Reported on 08/18/2019 12/12/17   Erasmo Downer, MD  cyclobenzaprine (FLEXERIL) 5 MG tablet  Take 1 tablet (5 mg total) by mouth 3 (three) times daily as needed for muscle spasms. 07/30/19   Erasmo Downer, MD  docusate sodium (COLACE) 250 MG capsule Take 250 mg by mouth daily.    [provider]  loratadine (CLARITIN) 10 MG tablet Take 10 mg by mouth daily.    [provider]  norethindrone (MICRONOR) 0.35 MG tablet Take 1 tablet (0.35 mg total) by mouth daily. Patient not taking: Reported on 01/16/2020 08/18/19   Farrel Conners, CNM  polyethylene glycol [facility] / Ethelene Hal) packet Take 17 g by mouth daily.    [provider]  SUMAtriptan (IMITREX) 50 MG tablet Take 1 tablet (50 mg total) by mouth every 2 (two) hours as needed for migraine. May repeat in 2 hours if headache persists or recurs. 10/01/20   Margaretann Loveless, PA-C  Allergies    Penicillins  Review of Systems   Review of Systems  Physical Exam Updated Vital Signs BP 108/76   Pulse 88   Temp 98 F (36.7 C) (Oral)   Resp 18   Ht 5\' 1"  (1.549 m)   Wt 83.9 kg   SpO2 99%   BMI 34.96 kg/m   Physical Exam  ED Results / Procedures / Treatments   Labs (all labs ordered are listed, but only abnormal results are displayed) Labs Reviewed - No data to display  EKG None  Radiology DG Knee Complete 4 Views Right  Result Date: 09/20/2021 CLINICAL DATA:  Right-sided knee pain EXAM: RIGHT KNEE - COMPLETE 4+ VIEW COMPARISON:  None. FINDINGS: Dislocation of the knee with the patella, proximal tibia and fibula displaced laterally with respect to the distal femur. The proximal tibia and fibula appear rotated. Small fracture fragment adjacent to the lateral femoral condyle. Suspect fracture of the posterior tibial plateau. IMPRESSION: Grossly abnormal alignment at the knee with dislocation of the patella, proximal tibia and fibula with respect to distal femur and probable rotation of the tibia and fibula. Small fracture fragments adjacent to the femoral condyles and suspected posterior  tibial plateau fracture. Electronically Signed   By: 09/22/2021 M.D.   On: 09/20/2021 16:02    Procedures .Sedation  Date/Time: 09/20/2021 4:44 PM Performed by: 09/22/2021, MD Authorized by: Ernie Avena, MD   Consent:    Consent obtained:  Verbal and written   Consent given by:  Patient   Risks discussed:  Inadequate sedation, nausea, vomiting, respiratory compromise necessitating ventilatory assistance and intubation and prolonged hypoxia resulting in organ damage Universal protocol:    Immediately prior to procedure, a time out was called: yes   Pre-sedation assessment:    Time since last food or drink:  Over 12 hours   ASA classification: class 1 - normal, healthy patient     Mouth opening:  3 or more finger widths   Thyromental distance:  3 finger widths   Mallampati score:  I - soft palate, uvula, fauces, pillars visible   Neck mobility: normal     Pre-sedation assessments completed and reviewed: airway patency, cardiovascular function, hydration status, mental status, nausea/vomiting, pain level, respiratory function and temperature     Pre-sedation assessment completed:  09/20/2021 4:30 PM Immediate pre-procedure details:    Reassessment: Patient reassessed immediately prior to procedure     Reviewed: vital signs, relevant labs/tests and NPO status     Verified: bag valve mask available, emergency equipment available, intubation equipment available, IV patency confirmed, oxygen available, reversal medications available and suction available   Procedure details (see MAR for exact dosages):    Preoxygenation:  Nasal cannula   Sedation:  Propofol   Intended level of sedation: deep   Analgesia:  None   Intra-procedure monitoring:  Blood pressure monitoring, cardiac monitor, continuous capnometry, continuous pulse oximetry, frequent LOC assessments and frequent vital sign checks   Intra-procedure events: none     Total Provider sedation time (minutes):  12 Post-procedure  details:    Post-sedation assessment completed:  09/20/2021 4:46 PM   Attendance: Constant attendance by certified staff until patient recovered     Recovery: Patient returned to pre-procedure baseline     Post-sedation assessments completed and reviewed: airway patency, cardiovascular function, hydration status, mental status, nausea/vomiting, pain level and respiratory function     Patient is stable for discharge or admission: yes     Procedure completion:  Tolerated  well, no immediate complications   Medications Ordered in ED Medications  tetanus & diphtheria toxoids (adult) (TENIVAC) injection 0.5 mL (has no administration in time range)  propofol (DIPRIVAN) 10 mg/mL bolus/IV push 42 mg (has no administration in time range)  PHENYLephrine 40 mcg/ml in normal saline Adult IV Push Syringe (For Blood Pressure Support) (has no administration in time range)  propofol (DIPRIVAN) 10 mg/mL bolus/IV push (has no administration in time range)  fentaNYL (SUBLIMAZE) injection 100 mcg (100 mcg Intravenous Given 09/20/21 1459)  fentaNYL (SUBLIMAZE) injection 100 mcg (100 mcg Intravenous Given 09/20/21 1525)    ED Course  I have reviewed the triage vital signs and the nursing notes.  Pertinent labs & imaging results that were available during my care of the patient were reviewed by me and considered in my medical decision making (see chart for details).   Final Clinical Impression(s) / ED Diagnoses Final diagnoses:  None    Rx / DC Orders ED Discharge Orders     None        Ernie Avena, MD 09/20/21 1646

## 2021-09-20 NOTE — ED Triage Notes (Signed)
Ems brings pt in for right knee pain. Pt was riding a lawnmower on a hill and it tipped over. When pt fell off the lawn mower the right knee hit the concrete.

## 2021-09-21 ENCOUNTER — Encounter (HOSPITAL_COMMUNITY): Payer: Self-pay | Admitting: Student

## 2021-09-21 NOTE — Consult Note (Signed)
ORTHOPAEDIC CONSULTATION  REQUESTING PHYSICIAN: No att. providers found  PCP:  Dorothey Baseman, MD  Chief Complaint: Right knee injury  HPI: Tiffany [name] is a 30 y.o. female who complains of right knee injury.  Patient was using a riding mower when she fell off of a 10 foot retaining wall.  The mower fell onto her right knee.  She had immediate right knee pain and inability to weight-bear.  Reports mild paresthesias to the toes.  Presented to the emergency department, where x-rays revealed a lateral right knee dislocation.  She had a couple small, superficial abrasions over the anterolateral aspect of the knee.  She underwent conscious sedation and closed reduction in the emergency department.  Post reduction x-rays revealed concentric reduction of the knee.  2+ pedal pulses were present before and after the reduction.  Orthopedic consultation was placed for management of her right knee injury.  Past Medical History:  Diagnosis Date   Allergic rhinitis    Allergy    Anxiety    Heart murmur    Migraine with aura    Mononucleosis    30 yo   Past Surgical History:  Procedure Laterality Date   WISDOM TOOTH EXTRACTION     Social History   Socioeconomic History   Marital status: Married    Spouse name: Shanda Bumps   Number of children: 0   Years of education: 16   Highest education level: Some college, no degree  Occupational History   Occupation: Leisure centre manager  Tobacco Use   Smoking status: Former    Types: Cigarettes   Smokeless tobacco: Never   Tobacco comments:    1-2 cigarettes per week  Vaping Use   Vaping Use: Never used  Substance and Sexual Activity   Alcohol use: Yes    Alcohol/week: 1.0 standard drink    Types: 1 Standard drinks or equivalent per week    Comment: 1/week   Drug use: No   Sexual activity: Yes    Partners: Female    Birth control/protection: I.U.D.  Other Topics Concern   Not on file  Social History Narrative   Not on file   Social  Determinants of Health   Financial Resource Strain: Not on file  Food Insecurity: Not on file  Transportation Needs: Not on file  Physical Activity: Not on file  Stress: Not on file  Social Connections: Not on file   Family History  Problem Relation Age of Onset   Clotting disorder Mother        has had frequent DVTs   Colon polyps Mother        was told they were cancerous   Healthy Father    Healthy Sister    Migraines Maternal Grandmother    Healthy Sister    Pancreatic cancer Cousin        second cousin   Breast cancer Neg Hx    Ovarian cancer Neg Hx    Allergies  Allergen Reactions   Penicillins Rash   Prior to Admission medications   Medication Sig Start Date End Date Taking? Authorizing Provider  cetirizine (ZYRTEC) 10 MG tablet Take 10 mg by mouth daily.   Yes [provider]  Echinacea 125 MG CAPS Take 1-2 capsules by mouth daily.   Yes [provider]  HYDROcodone-acetaminophen (NORCO/VICODIN) 5-325 MG tablet Take 1 tablet by mouth every 6 (six) hours as needed for severe pain. 09/20/21  Yes Plunkett, Alphonzo Lemmings, MD  methylphenidate (RITALIN LA) 10 MG 24 hr capsule  Take 20 mg by mouth daily. 07/18/21  Yes [provider]  sertraline (ZOLOFT) 50 MG tablet Take 50 mg by mouth daily. 08/16/21  Yes [provider]  SUMAtriptan (IMITREX) 50 MG tablet Take 1 tablet (50 mg total) by mouth every 2 (two) hours as needed for migraine. May repeat in 2 hours if headache persists or recurs. 10/01/20  Yes Margaretann Loveless, PA-C  citalopram (CELEXA) 20 MG tablet Take 1 tablet (20 mg total) by mouth daily. Patient not taking: No sig reported 12/12/17   Erasmo Downer, MD  cyclobenzaprine (FLEXERIL) 5 MG tablet Take 1 tablet (5 mg total) by mouth 3 (three) times daily as needed for muscle spasms. Patient not taking: No sig reported 07/30/19   Erasmo Downer, MD  norethindrone (MICRONOR) 0.35 MG tablet Take 1 tablet (0.35 mg total) by mouth  daily. Patient not taking: No sig reported 08/18/19   Farrel Conners, CNM   DG Knee 2 Views Right  Result Date: 09/20/2021 CLINICAL DATA:  knee pain, postreduction EXAM: RIGHT KNEE - 2 VIEW COMPARISON:  Earlier same day FINDINGS: There is now normal alignment of the right knee postreduction. No definite acute fracture. Normal mineralization. Probable small knee joint effusion. The soft tissues are otherwise unremarkable. IMPRESSION: Normal alignment of the right knee status post reduction. No definite acute fracture. Small knee joint effusion. Electronically Signed   By: Olive Bass M.D.   On: 09/20/2021 17:16   MR KNEE RIGHT WO CONTRAST  Result Date: 09/20/2021 CLINICAL DATA:  Larey Seat. Injured knee. Dislocation injury. EXAM: MRI OF THE RIGHT KNEE WITHOUT CONTRAST TECHNIQUE: Multiplanar, multisequence MR imaging of the knee was performed. No intravenous contrast was administered. COMPARISON:  Radiographs, same date. FINDINGS: MENISCI Medial meniscus:  Probable meniscal contusion but no definite tear. Lateral meniscus: The posterior horn is completely torn and macerated. The anterior horn and midbody region are undermined by fluid likely due to meniscotibial coronary ligament rupture. LIGAMENTS Cruciates:  The ACL and PCL are both ruptured. Collaterals: Complete MCL tear. Grade 2 lateral collateral ligament complex injury but no complete tear/rupture of the fibular collateral ligament. The iliotibial band is intact and the biceps femoris tendon is intact but definitely strained. The popliteus tendon is injured but not completely torn and I suspect the arcuate ligament is torn. CARTILAGE Patellofemoral:  Intact Medial:  Intact Lateral:  Cartilage contusions but no definite delamination injury. Joint:  Large joint effusion. Popliteal Fossa:  No popliteal mass or Baker's cyst. Extensor Mechanism: The medial retinaculum and medial patellofemoral ligaments are completely torn. The lateral retinaculum is  stretched and injured but grossly intact. Bones: Tibial and femoral bone contusions. Large subchondral impaction type injury involving the lateral femoral condyle anteriorly. There are also posterior tibial bone contusions and a larger central bone contusion. No discrete depressed fractures. Other: Muscle strains/partial tears involving the distal VMO and also the biceps femoris muscle. IMPRESSION: 1. Complete ACL and PCL tears. Associated significant bone contusions. 2. The MCL is completely torn and the medial retinaculum and medial patellofemoral ligaments are completely torn. 3. Grade 2 lateral collateral ligament complex injury. Suspect popliteus tendon injury but no complete tear. I suspect the arcuate ligament is completely torn. 4. The posterior horn of the lateral meniscus is torn. 5. Large joint effusion. Electronically Signed   By: Rudie Meyer M.D.   On: 09/20/2021 20:24   DG Knee Complete 4 Views Right  Result Date: 09/20/2021 CLINICAL DATA:  Right-sided knee pain EXAM: RIGHT KNEE -  COMPLETE 4+ VIEW COMPARISON:  None. FINDINGS: Dislocation of the knee with the patella, proximal tibia and fibula displaced laterally with respect to the distal femur. The proximal tibia and fibula appear rotated. Small fracture fragment adjacent to the lateral femoral condyle. Suspect fracture of the posterior tibial plateau. IMPRESSION: Grossly abnormal alignment at the knee with dislocation of the patella, proximal tibia and fibula with respect to distal femur and probable rotation of the tibia and fibula. Small fracture fragments adjacent to the femoral condyles and suspected posterior tibial plateau fracture. Electronically Signed   By: Jasmine Pang M.D.   On: 09/20/2021 16:02    Positive ROS: All other systems have been reviewed and were otherwise negative with the exception of those mentioned in the HPI and as above.  Physical Exam: General: Alert, no acute distress Cardiovascular: No pedal  edema Respiratory: No cyanosis, no use of accessory musculature GI: No organomegaly, abdomen is soft and non-tender Skin: No lesions in the area of chief complaint Neurologic: Sensation intact distally Psychiatric: Patient is competent for consent with normal mood and affect Lymphatic: No axillary or cervical lymphadenopathy  MUSCULOSKELETAL: Examination of the right lower extremity reveals that she is in a long-leg posterior splint.  She has positive motor function dorsiflexion, plantarflexion, and great toe extension.  She reports mild diffuse paresthesias.  2+ DP and PT pulses are present.  Assessment: Right lateral knee dislocation, closed injury.  Plan: I discussed the findings with the patient and mother.  She had a successful closed reduction in the emergency department.  She was placed into a long-leg splint.  She clearly has a multiligamentous knee injury.  She will get an MRI before she leaves.  Nonweightbearing right lower extremity with crutches.  Family is comfortable and competent to perform serial motor and vascular checks.  They are instructed to return to the emergency department immediately if there is any change in her pulse or motor function.  She will follow-up in the office within 1 week.  She is going to need right knee reconstruction, and the family wants to proceed with Dr. Thomasena Edis.  All questions were solicited and answered.    Jonette Pesa, MD [phone]    09/21/2021 9:52 AM

## 2021-10-05 NOTE — Progress Notes (Signed)
Please enter orders for  surgery scheduled for 10-19-21.

## 2021-10-11 NOTE — Patient Instructions (Addendum)
DUE TO COVID-19 ONLY ONE VISITOR IS ALLOWED TO COME WITH YOU AND STAY IN THE WAITING ROOM ONLY DURING PRE OP AND PROCEDURE.   **NO VISITORS ARE ALLOWED IN THE SHORT STAY AREA OR RECOVERY ROOM!!**  IF YOU WILL BE ADMITTED INTO THE HOSPITAL YOU ARE ALLOWED ONLY TWO SUPPORT PEOPLE DURING VISITATION HOURS ONLY (7AM -8PM)   The support person(s) may change daily. The support person(s) must pass our screening, gel in and out, and wear a mask at all times, including in the patient's room. Patients must also wear a mask when staff or their support person are in the room.  No visitors under the age of 55. Any visitor under the age of 25 must be accompanied by an adult.    COVID SWAB TESTING MUST BE COMPLETED ON:  10/17/21 **MUST PRESENT COMPLETED FORM AT TESTING SITE**    [address] [place] (backside of the building) No appointment needed. Open 8am-3pm. You are not required to quarantine, however you are required to wear a well-fitted mask when you are out and around people not in your household.  Hand Hygiene often Do NOT share personal items Notify your provider if you are in close contact with someone who has COVID or you develop fever 100.4 or greater, new onset of sneezing, cough, sore throat, shortness of breath or body aches.   Your procedure is scheduled on: 10/19/21   Report to [facility] Main Entrance   Report to Short Stay at 5:15 AM   Call this number if you have problems the morning of surgery [phone]   Do not eat food :After Midnight.   May have liquids until 4:30 AM day of surgery  CLEAR LIQUID DIET  Foods Allowed                                                                     Foods Excluded  Water, Black Coffee and tea (no milk or creamer)           liquids that you cannot  Plain Jell-O in any flavor  (No red)                                    see through such as: Fruit ices (not with fruit pulp)                                             milk, soups, orange juice              Iced Popsicles (No red)                                               All solid food                                   Apple juices Sports drinks  like Gatorade (No red) Lightly seasoned clear broth or consume(fat free) Sugar    The day of surgery:  Drink ONE (1) Pre-Surgery Clear Ensure by 4:30 am the morning of surgery. Drink in one sitting. Do not sip.  This drink was given to you during your hospital  pre-op appointment visit. Nothing else to drink after completing the  Pre-Surgery Clear Ensure.          If you have questions, please contact your surgeon's office.     Oral Hygiene is also important to reduce your risk of infection.                                    Remember - BRUSH YOUR TEETH THE MORNING OF SURGERY WITH YOUR REGULAR TOOTHPASTE   Take these medicines the morning of surgery with A SIP OF WATER: Zyrtec, Zoloft                              You may not have any metal on your body including hair pins, jewelry, and body piercing             Do not wear make-up, lotions, powders, perfumes, or deodorant  Do not wear nail polish including gel and S&S, artificial/acrylic nails, or any other type of covering on natural nails including finger and toenails. If you have artificial nails, gel coating, etc. that needs to be removed by a nail salon please have this removed prior to surgery or surgery may need to be canceled/ delayed if the surgeon/ anesthesia feels like they are unable to be safely monitored.   Do not shave  48 hours prior to surgery.             Do not bring valuables to the hospital. [facility] IS NOT             RESPONSIBLE   FOR VALUABLES.   Bring small overnight bag day of surgery.   Special Instructions: Bring a copy of your healthcare power of attorney and living will documents         the day of surgery if you haven't scanned them before.   Please read over the following fact sheets you were given: IF YOU HAVE  QUESTIONS ABOUT YOUR PRE-OP INSTRUCTIONS PLEASE CALL [phone]- [facility] - Preparing for Surgery Before surgery, you can play an important role.  Because skin is not sterile, your skin needs to be as free of germs as possible.  You can reduce the number of germs on your skin by washing with CHG (chlorahexidine gluconate) soap before surgery.  CHG is an antiseptic cleaner which kills germs and bonds with the skin to continue killing germs even after washing. Please DO NOT use if you have an allergy to CHG or antibacterial soaps.  If your skin becomes reddened/irritated stop using the CHG and inform your nurse when you arrive at Short Stay. Do not shave (including legs and underarms) for at least 48 hours prior to the first CHG shower.  You may shave your face/neck.  Please follow these instructions carefully:  1.  Shower with CHG Soap the night before surgery and the  morning of surgery.  2.  If you choose to wash your hair, wash your hair first as usual with your normal  shampoo.  3.  After you shampoo,  rinse your hair and body thoroughly to remove the shampoo.                             4.  Use CHG as you would any other liquid soap.  You can apply chg directly to the skin and wash.  Gently with a scrungie or clean washcloth.  5.  Apply the CHG Soap to your body ONLY FROM THE NECK DOWN.   Do   not use on face/ open                           Wound or open sores. Avoid contact with eyes, ears mouth and   genitals (private parts).                       Wash face,  Genitals (private parts) with your normal soap.             6.  Wash thoroughly, paying special attention to the area where your    surgery  will be performed.  7.  Thoroughly rinse your body with warm water from the neck down.  8.  DO NOT shower/wash with your normal soap after using and rinsing off the CHG Soap.                9.  Pat yourself dry with a clean towel.            10.  Wear clean pajamas.            11.   Place clean sheets on your bed the night of your first shower and do not  sleep with pets. Day of Surgery : Do not apply any lotions/deodorants the morning of surgery.  Please wear clean clothes to the hospital/surgery center.  FAILURE TO FOLLOW THESE INSTRUCTIONS MAY RESULT IN THE CANCELLATION OF YOUR SURGERY  PATIENT SIGNATURE_________________________________  NURSE SIGNATURE__________________________________  ________________________________________________________________________   Tiffany [name]  An incentive spirometer is a tool that can help keep your lungs clear and active. This tool measures how well you are filling your lungs with each breath. Taking long deep breaths may help reverse or decrease the chance of developing breathing (pulmonary) problems (especially infection) following: A long period of time when you are unable to move or be active. BEFORE THE PROCEDURE  If the spirometer includes an indicator to show your best effort, your nurse or respiratory therapist will set it to a desired goal. If possible, sit up straight or lean slightly forward. Try not to slouch. Hold the incentive spirometer in an upright position. INSTRUCTIONS FOR USE  Sit on the edge of your bed if possible, or sit up as far as you can in bed or on a chair. Hold the incentive spirometer in an upright position. Breathe out normally. Place the mouthpiece in your mouth and seal your lips tightly around it. Breathe in slowly and as deeply as possible, raising the piston or the ball toward the top of the column. Hold your breath for 3-5 seconds or for as long as possible. Allow the piston or ball to fall to the bottom of the column. Remove the mouthpiece from your mouth and breathe out normally. Rest for a few seconds and repeat Steps 1 through 7 at least 10 times every 1-2 hours when you are awake. Take your time and take a few normal breaths between  deep breaths. The spirometer may include an  indicator to show your best effort. Use the indicator as a goal to work toward during each repetition. After each set of 10 deep breaths, practice coughing to be sure your lungs are clear. If you have an incision (the cut made at the time of surgery), support your incision when coughing by placing a pillow or rolled up towels firmly against it. Once you are able to get out of bed, walk around indoors and cough well. You may stop using the incentive spirometer when instructed by your caregiver.  RISKS AND COMPLICATIONS Take your time so you do not get dizzy or light-headed. If you are in pain, you may need to take or ask for pain medication before doing incentive spirometry. It is harder to take a deep breath if you are having pain. AFTER USE Rest and breathe slowly and easily. It can be helpful to keep track of a log of your progress. Your caregiver can provide you with a simple table to help with this. If you are using the spirometer at home, follow these instructions: SEEK MEDICAL CARE IF:  You are having difficultly using the spirometer. You have trouble using the spirometer as often as instructed. Your pain medication is not giving enough relief while using the spirometer. You develop fever of 100.5 F (38.1 C) or higher. SEEK IMMEDIATE MEDICAL CARE IF:  You cough up bloody sputum that had not been present before. You develop fever of 102 F (38.9 C) or greater. You develop worsening pain at or near the incision site. MAKE SURE YOU:  Understand these instructions. Will watch your condition. Will get help right away if you are not doing well or get worse. Document Released: 04/23/2007 Document Revised: 03/04/2012 Document Reviewed: 06/24/2007 [facility] Patient Information 2014 [place], [place].   ________________________________________________________________________

## 2021-10-11 NOTE — Progress Notes (Addendum)
COVID swab appointment: 10/17/21  COVID Vaccine Completed: no Date COVID Vaccine completed: Has received booster: COVID vaccine manufacturer: [employer]   Date of COVID positive in last 90 days: no  PCP - Dorothey Baseman, MD  Cardiologist - n/a  Chest x-ray - n/a EKG - n/a Stress Test - n/a ECHO - n/a Cardiac Cath - n/a Pacemaker/ICD device last checked: n/a Spinal Cord Stimulator: n/a  Sleep Study - n/a CPAP -   Fasting Blood Sugar - n/a Checks Blood Sugar _____ times a day  Blood Thinner Instructions:  Aspirin Instructions: Last Dose: 10/12/21  Activity level: Can go up a flight of stairs and perform activities of daily living without stopping and without symptoms of chest pain or shortness of breath. Uses crutches to get up stairs     Anesthesia review:   Patient denies shortness of breath, fever, cough and chest pain at PAT appointment  Pateint inter viewede over  phone  Patient verbalized understanding of instructions that were given to them at the PAT appointment. Patient was also instructed that they will need to review over the PAT instructions again at home before surgery.

## 2021-10-12 ENCOUNTER — Encounter (HOSPITAL_COMMUNITY)
Admission: RE | Admit: 2021-10-12 | Discharge: 2021-10-12 | Disposition: A | Discharge status: Home / Self Care | Payer: Worker's Compensation | Source: Ambulatory Visit | Attending: Specialist | Admitting: Specialist

## 2021-10-12 ENCOUNTER — Other Ambulatory Visit: Payer: Self-pay

## 2021-10-12 ENCOUNTER — Encounter (HOSPITAL_COMMUNITY): Payer: Self-pay

## 2021-10-12 DIAGNOSIS — X58XXXA Exposure to other specified factors, initial encounter: Secondary | ICD-10-CM | POA: Insufficient documentation

## 2021-10-12 DIAGNOSIS — S83511A Sprain of anterior cruciate ligament of right knee, initial encounter: Secondary | ICD-10-CM | POA: Insufficient documentation

## 2021-10-12 DIAGNOSIS — Z01812 Encounter for preprocedural laboratory examination: Secondary | ICD-10-CM | POA: Diagnosis not present

## 2021-10-12 HISTORY — DX: Attention-deficit hyperactivity disorder, unspecified type: F90.9

## 2021-10-12 HISTORY — DX: Depression, unspecified: F32.A

## 2021-10-12 LAB — COMPREHENSIVE METABOLIC PANEL
ALT: 18 U/L (ref 0–44)
AST: 20 U/L (ref 15–41)
Albumin: 4.2 g/dL (ref 3.5–5.0)
Alkaline Phosphatase: 58 U/L (ref 38–126)
Anion gap: 8 (ref 5–15)
BUN: 14 mg/dL (ref 6–20)
CO2: 24 mmol/L (ref 22–32)
Calcium: 9.1 mg/dL (ref 8.9–10.3)
Chloride: 104 mmol/L (ref 98–111)
Creatinine, Ser: 0.52 mg/dL (ref 0.44–1.00)
GFR, Estimated: >60 mL/min (ref >60)
Glucose, Bld: 88 mg/dL (ref 70–99)
Potassium: 4.2 mmol/L (ref 3.5–5.1)
Sodium: 136 mmol/L (ref 135–145)
Total Bilirubin: 0.4 mg/dL (ref 0.3–1.2)
Total Protein: 7.8 g/dL (ref 6.5–8.1)

## 2021-10-12 LAB — CBC
HCT: 40 % (ref 36.0–46.0)
Hemoglobin: 13.4 g/dL (ref 12.0–15.0)
MCH: 29.3 pg (ref 26.0–34.0)
MCHC: 33.5 g/dL (ref 30.0–36.0)
MCV: 87.5 fL (ref 80.0–100.0)
Platelets: 304 10*3/uL (ref 150–400)
RBC: 4.57 MIL/uL (ref 3.87–5.11)
RDW: 13.5 % (ref 11.5–15.5)
WBC: 7.1 10*3/uL (ref 4.0–10.5)
nRBC: 0 % (ref 0.0–0.2)

## 2021-10-12 NOTE — Patient Instructions (Signed)
DUE TO COVID-19 ONLY ONE VISITOR IS ALLOWED TO COME WITH YOU AND STAY IN THE WAITING ROOM ONLY DURING PRE OP AND PROCEDURE.   **NO VISITORS ARE ALLOWED IN THE SHORT STAY AREA OR RECOVERY ROOM!!**  IF YOU WILL BE ADMITTED INTO THE HOSPITAL YOU ARE ALLOWED ONLY TWO SUPPORT PEOPLE DURING VISITATION HOURS ONLY (10AM -8PM)   The support person(s) may change daily. The support person(s) must pass our screening, gel in and out, and wear a mask at all times, including in the patient's room. Patients must also wear a mask when staff or their support person are in the room.  No visitors under the age of 52. Any visitor under the age of 32 must be accompanied by an adult.    COVID SWAB TESTING MUST BE COMPLETED ON:  10/24/21 @ 10:00 AM   You are not required to quarantine, however you are required to wear a well-fitted mask when you are out and around people not in your household.  Hand Hygiene often Do NOT share personal items Notify your provider if you are in close contact with someone who has COVID or you develop fever 100.4 or greater, new onset of sneezing, cough, sore throat, shortness of breath or body aches.  [facility] Entrance [address], [address], must go inside of the hospital, NOT A DRIVE THRU!  (Must self quarantine after testing. Follow instructions on handout.)

## 2021-10-17 ENCOUNTER — Other Ambulatory Visit: Payer: Self-pay

## 2021-10-17 ENCOUNTER — Other Ambulatory Visit
Admission: RE | Admit: 2021-10-17 | Discharge: 2021-10-17 | Disposition: A | Discharge status: Home / Self Care | Payer: Worker's Compensation | Source: Ambulatory Visit | Attending: Specialist | Admitting: Specialist

## 2021-10-17 DIAGNOSIS — Z20822 Contact with and (suspected) exposure to covid-19: Secondary | ICD-10-CM | POA: Diagnosis not present

## 2021-10-17 DIAGNOSIS — Z01812 Encounter for preprocedural laboratory examination: Secondary | ICD-10-CM | POA: Insufficient documentation

## 2021-10-17 LAB — SARS CORONAVIRUS 2 (TAT 6-24 HRS): SARS Coronavirus 2: NEGATIVE

## 2021-10-18 NOTE — Anesthesia Preprocedure Evaluation (Addendum)
Anesthesia Evaluation  Patient identified by MRN, date of birth, ID band Patient awake    Reviewed: Allergy & Precautions, H&P , NPO status , Patient's Chart, lab work & pertinent test results  Airway Mallampati: I  TM Distance: >3 FB Neck ROM: Full    Dental no notable dental hx. (+) Teeth Intact, Dental Advisory Given   Pulmonary neg pulmonary ROS, former smoker,    Pulmonary exam normal breath sounds clear to auscultation       Cardiovascular Exercise Tolerance: Good negative cardio ROS Normal cardiovascular exam Rhythm:Regular Rate:Normal     Neuro/Psych  Headaches, PSYCHIATRIC DISORDERS Anxiety Depression negative neurological ROS  negative psych ROS   GI/Hepatic negative GI ROS, Neg liver ROS,   Endo/Other  negative endocrine ROS  Renal/GU negative Renal ROS  negative genitourinary   Musculoskeletal negative musculoskeletal ROS (+)   Abdominal   Peds negative pediatric ROS (+)  Hematology negative hematology ROS (+)   Anesthesia Other Findings   Reproductive/Obstetrics negative OB ROS                           Anesthesia Physical Anesthesia Plan  ASA: 2  Anesthesia Plan: General   Post-op Pain Management: GA combined w/ Regional for post-op pain   Induction: Intravenous  PONV Risk Score and Plan: 3 and Ondansetron, Dexamethasone, Treatment may vary due to age or medical condition and Midazolam  Airway Management Planned: Oral ETT and LMA  Additional Equipment: None  Intra-op Plan:   Post-operative Plan: Extubation in OR  Informed Consent: I have reviewed the patients History and Physical, chart, labs and discussed the procedure including the risks, benefits and alternatives for the proposed anesthesia with the patient or authorized representative who has indicated his/her understanding and acceptance.     Dental advisory given  Plan Discussed with: Anesthesiologist  and CRNA  Anesthesia Plan Comments: (Discussed both nerve block for pain relief post-op and GA; including NV, sore throat, dental injury, and pulmonary complications)       Anesthesia Quick Evaluation

## 2021-10-19 ENCOUNTER — Observation Stay (HOSPITAL_COMMUNITY)
Admission: RE | Admit: 2021-10-19 | Discharge: 2021-10-20 | Disposition: A | Discharge status: Home / Self Care | Payer: Worker's Compensation | Attending: Specialist | Admitting: Specialist

## 2021-10-19 ENCOUNTER — Ambulatory Visit (HOSPITAL_COMMUNITY): Payer: Worker's Compensation | Admitting: Anesthesiology

## 2021-10-19 ENCOUNTER — Encounter (HOSPITAL_COMMUNITY)
Admission: RE | Disposition: A | Discharge status: Home / Self Care | Payer: Self-pay | Source: Home / Self Care | Attending: Specialist

## 2021-10-19 ENCOUNTER — Encounter (HOSPITAL_COMMUNITY): Payer: Self-pay | Admitting: Specialist

## 2021-10-19 ENCOUNTER — Other Ambulatory Visit: Payer: Self-pay

## 2021-10-19 DIAGNOSIS — Z87891 Personal history of nicotine dependence: Secondary | ICD-10-CM | POA: Diagnosis not present

## 2021-10-19 DIAGNOSIS — S83511A Sprain of anterior cruciate ligament of right knee, initial encounter: Principal | ICD-10-CM | POA: Diagnosis present

## 2021-10-19 DIAGNOSIS — Y9389 Activity, other specified: Secondary | ICD-10-CM | POA: Diagnosis not present

## 2021-10-19 DIAGNOSIS — S83521A Sprain of posterior cruciate ligament of right knee, initial encounter: Secondary | ICD-10-CM | POA: Diagnosis not present

## 2021-10-19 DIAGNOSIS — S83411A Sprain of medial collateral ligament of right knee, initial encounter: Secondary | ICD-10-CM | POA: Insufficient documentation

## 2021-10-19 DIAGNOSIS — S83251A Bucket-handle tear of lateral meniscus, current injury, right knee, initial encounter: Secondary | ICD-10-CM | POA: Diagnosis not present

## 2021-10-19 DIAGNOSIS — S83211A Bucket-handle tear of medial meniscus, current injury, right knee, initial encounter: Secondary | ICD-10-CM | POA: Diagnosis not present

## 2021-10-19 DIAGNOSIS — W28XXXA Contact with powered lawn mower, initial encounter: Secondary | ICD-10-CM | POA: Insufficient documentation

## 2021-10-19 HISTORY — PX: KNEE ARTHROSCOPY WITH ANTERIOR CRUCIATE LIGAMENT (ACL) REPAIR: SHX5644

## 2021-10-19 LAB — CBC
HCT: 38.3 % (ref 36.0–46.0)
Hemoglobin: 12.6 g/dL (ref 12.0–15.0)
MCH: 29.4 pg (ref 26.0–34.0)
MCHC: 32.9 g/dL (ref 30.0–36.0)
MCV: 89.3 fL (ref 80.0–100.0)
Platelets: 253 10*3/uL (ref 150–400)
RBC: 4.29 MIL/uL (ref 3.87–5.11)
RDW: 13.6 % (ref 11.5–15.5)
WBC: 10 10*3/uL (ref 4.0–10.5)
nRBC: 0 % (ref 0.0–0.2)

## 2021-10-19 LAB — HCG, SERUM, QUALITATIVE: Preg, Serum: NEGATIVE

## 2021-10-19 LAB — CREATININE, SERUM
Creatinine, Ser: 0.76 mg/dL (ref 0.44–1.00)
GFR, Estimated: >60 mL/min (ref >60)

## 2021-10-19 SURGERY — KNEE ARTHROSCOPY WITH ANTERIOR CRUCIATE LIGAMENT (ACL) REPAIR
Anesthesia: General | Site: Knee | Laterality: Right

## 2021-10-19 MED ORDER — KETAMINE HCL 10 MG/ML IJ SOLN
INTRAMUSCULAR | Status: AC
  Filled 2021-10-19: qty 1

## 2021-10-19 MED ORDER — FENTANYL CITRATE (PF) 100 MCG/2ML IJ SOLN
INTRAMUSCULAR | Status: AC
  Filled 2021-10-19: qty 2

## 2021-10-19 MED ORDER — FENTANYL CITRATE PF 50 MCG/ML IJ SOSY
PREFILLED_SYRINGE | INTRAMUSCULAR | Status: AC
  Filled 2021-10-19: qty 1

## 2021-10-19 MED ORDER — KETAMINE HCL 10 MG/ML IJ SOLN
INTRAMUSCULAR | Status: DC | PRN
  Administered 2021-10-19: 50 mg via INTRAVENOUS

## 2021-10-19 MED ORDER — HYDROCODONE-ACETAMINOPHEN 7.5-325 MG PO TABS
1.0000 | ORAL_TABLET | ORAL | Status: DC | PRN
  Administered 2021-10-19: 1 via ORAL
  Administered 2021-10-20 (×3): 2 via ORAL
  Filled 2021-10-19: qty 2
  Filled 2021-10-19: qty 1
  Filled 2021-10-19 (×2): qty 2

## 2021-10-19 MED ORDER — CEFAZOLIN SODIUM-DEXTROSE 2-4 GM/100ML-% IV SOLN
2.0000 g | INTRAVENOUS | Status: AC
  Administered 2021-10-19: 2 g via INTRAVENOUS
  Filled 2021-10-19: qty 100

## 2021-10-19 MED ORDER — TRAMADOL HCL 50 MG PO TABS
50.0000 mg | ORAL_TABLET | Freq: Four times a day (QID) | ORAL | Status: DC
  Administered 2021-10-19 – 2021-10-20 (×3): 50 mg via ORAL
  Filled 2021-10-19 (×3): qty 1

## 2021-10-19 MED ORDER — SODIUM CHLORIDE 0.9 % IR SOLN
Status: DC | PRN
  Administered 2021-10-19 (×15): 3000 mL

## 2021-10-19 MED ORDER — ONDANSETRON HCL 4 MG/2ML IJ SOLN
4.0000 mg | Freq: Four times a day (QID) | INTRAMUSCULAR | Status: DC | PRN
  Administered 2021-10-19 (×2): 4 mg via INTRAVENOUS
  Filled 2021-10-19 (×2): qty 2

## 2021-10-19 MED ORDER — FENTANYL CITRATE (PF) 100 MCG/2ML IJ SOLN
INTRAMUSCULAR | Status: DC | PRN
  Administered 2021-10-19 (×2): 100 ug via INTRAVENOUS

## 2021-10-19 MED ORDER — ACETAMINOPHEN 325 MG PO TABS: 325.0000 mg | ORAL_TABLET | Freq: Four times a day (QID) | ORAL | Status: DC | PRN

## 2021-10-19 MED ORDER — PROPOFOL 10 MG/ML IV BOLUS
INTRAVENOUS | Status: DC | PRN
  Administered 2021-10-19: 80 mg via INTRAVENOUS
  Administered 2021-10-19: 200 mg via INTRAVENOUS

## 2021-10-19 MED ORDER — HYDROMORPHONE HCL 2 MG/ML IJ SOLN
INTRAMUSCULAR | Status: AC
  Filled 2021-10-19: qty 1

## 2021-10-19 MED ORDER — ACETAMINOPHEN 500 MG PO TABS
500.0000 mg | ORAL_TABLET | Freq: Four times a day (QID) | ORAL | Status: DC
  Administered 2021-10-19 – 2021-10-20 (×2): 500 mg via ORAL
  Filled 2021-10-19 (×2): qty 1

## 2021-10-19 MED ORDER — BISACODYL 5 MG PO TBEC: 5.0000 mg | DELAYED_RELEASE_TABLET | Freq: Every day | ORAL | Status: DC | PRN

## 2021-10-19 MED ORDER — ONDANSETRON HCL 4 MG PO TABS: 4.0000 mg | ORAL_TABLET | Freq: Four times a day (QID) | ORAL | Status: DC | PRN

## 2021-10-19 MED ORDER — FENTANYL CITRATE PF 50 MCG/ML IJ SOSY
25.0000 ug | PREFILLED_SYRINGE | INTRAMUSCULAR | Status: DC | PRN
  Administered 2021-10-19: 25 ug via INTRAVENOUS
  Administered 2021-10-19: 50 ug via INTRAVENOUS

## 2021-10-19 MED ORDER — CHLORHEXIDINE GLUCONATE 0.12 % MT SOLN
15.0000 mL | Freq: Once | OROMUCOSAL | Status: AC
  Administered 2021-10-19: 15 mL via OROMUCOSAL

## 2021-10-19 MED ORDER — HYDROCODONE-ACETAMINOPHEN 5-325 MG PO TABS: 1.0000 | ORAL_TABLET | ORAL | Status: DC | PRN

## 2021-10-19 MED ORDER — HYDROMORPHONE HCL 1 MG/ML IJ SOLN
INTRAMUSCULAR | Status: DC | PRN
  Administered 2021-10-19 (×2): 1 mg via INTRAVENOUS

## 2021-10-19 MED ORDER — LIDOCAINE 2% (20 MG/ML) 5 ML SYRINGE
INTRAMUSCULAR | Status: DC | PRN
  Administered 2021-10-19: 100 mg via INTRAVENOUS

## 2021-10-19 MED ORDER — ACETAMINOPHEN 10 MG/ML IV SOLN
INTRAVENOUS | Status: DC | PRN
  Administered 2021-10-19: 1000 mg via INTRAVENOUS

## 2021-10-19 MED ORDER — METHOCARBAMOL 500 MG IVPB - SIMPLE MED
500.0000 mg | Freq: Four times a day (QID) | INTRAVENOUS | Status: DC | PRN
  Filled 2021-10-19: qty 50

## 2021-10-19 MED ORDER — SCOPOLAMINE 1 MG/3DAYS TD PT72
MEDICATED_PATCH | TRANSDERMAL | Status: DC | PRN
  Administered 2021-10-19: 1 via TRANSDERMAL

## 2021-10-19 MED ORDER — CEFAZOLIN SODIUM-DEXTROSE 1-4 GM/50ML-% IV SOLN
1.0000 g | Freq: Three times a day (TID) | INTRAVENOUS | Status: AC
Start: 2021-10-19 — End: 2021-10-20
  Administered 2021-10-19 – 2021-10-20 (×3): 1 g via INTRAVENOUS
  Filled 2021-10-19 (×3): qty 50

## 2021-10-19 MED ORDER — METHOCARBAMOL 500 MG PO TABS
500.0000 mg | ORAL_TABLET | Freq: Four times a day (QID) | ORAL | Status: DC | PRN
  Administered 2021-10-19 – 2021-10-20 (×2): 500 mg via ORAL
  Filled 2021-10-19 (×2): qty 1

## 2021-10-19 MED ORDER — FENTANYL CITRATE PF 50 MCG/ML IJ SOSY
PREFILLED_SYRINGE | INTRAMUSCULAR | Status: AC
  Administered 2021-10-19: 50 ug via INTRAVENOUS
  Filled 2021-10-19: qty 1

## 2021-10-19 MED ORDER — OXYCODONE HCL 5 MG PO TABS
5.0000 mg | ORAL_TABLET | Freq: Once | ORAL | Status: AC | PRN
Start: 2021-10-19 — End: 2021-10-19
  Administered 2021-10-19: 5 mg via ORAL

## 2021-10-19 MED ORDER — ACETAMINOPHEN 160 MG/5ML PO SOLN: 325.0000 mg | ORAL | Status: DC | PRN

## 2021-10-19 MED ORDER — POVIDONE-IODINE 10 % EX SWAB
2.0000 "application " | Freq: Once | CUTANEOUS | Status: AC
  Administered 2021-10-19: 2 via TOPICAL

## 2021-10-19 MED ORDER — BUPIVACAINE-EPINEPHRINE (PF) 0.5% -1:200000 IJ SOLN
INTRAMUSCULAR | Status: DC | PRN
  Administered 2021-10-19: 20 mL via PERINEURAL

## 2021-10-19 MED ORDER — SODIUM CHLORIDE 0.9 % IV SOLN: INTRAVENOUS | Status: DC

## 2021-10-19 MED ORDER — PROPOFOL 10 MG/ML IV BOLUS
INTRAVENOUS | Status: AC
  Filled 2021-10-19: qty 20

## 2021-10-19 MED ORDER — MEPERIDINE HCL 50 MG/ML IJ SOLN: 6.2500 mg | INTRAMUSCULAR | Status: DC | PRN

## 2021-10-19 MED ORDER — SCOPOLAMINE 1 MG/3DAYS TD PT72
MEDICATED_PATCH | TRANSDERMAL | Status: AC
  Filled 2021-10-19: qty 1

## 2021-10-19 MED ORDER — DEXAMETHASONE SODIUM PHOSPHATE 10 MG/ML IJ SOLN
INTRAMUSCULAR | Status: DC | PRN
  Administered 2021-10-19: 10 mg via INTRAVENOUS

## 2021-10-19 MED ORDER — FENTANYL CITRATE PF 50 MCG/ML IJ SOSY
PREFILLED_SYRINGE | INTRAMUSCULAR | Status: AC
  Administered 2021-10-19: 25 ug via INTRAVENOUS
  Filled 2021-10-19: qty 1

## 2021-10-19 MED ORDER — ONDANSETRON HCL 4 MG/2ML IJ SOLN: 4.0000 mg | Freq: Once | INTRAMUSCULAR | Status: DC | PRN

## 2021-10-19 MED ORDER — OXYCODONE HCL 5 MG/5ML PO SOLN: 5.0000 mg | Freq: Once | ORAL | Status: AC | PRN

## 2021-10-19 MED ORDER — ENOXAPARIN SODIUM 40 MG/0.4ML IJ SOSY
40.0000 mg | PREFILLED_SYRINGE | INTRAMUSCULAR | Status: DC
  Administered 2021-10-20: 40 mg via SUBCUTANEOUS
  Filled 2021-10-19: qty 0.4

## 2021-10-19 MED ORDER — ORAL CARE MOUTH RINSE: 15.0000 mL | Freq: Once | OROMUCOSAL | Status: AC

## 2021-10-19 MED ORDER — DIPHENHYDRAMINE HCL 12.5 MG/5ML PO ELIX: 12.5000 mg | ORAL_SOLUTION | ORAL | Status: DC | PRN

## 2021-10-19 MED ORDER — MIDAZOLAM HCL 2 MG/2ML IJ SOLN
INTRAMUSCULAR | Status: DC | PRN
  Administered 2021-10-19: 2 mg via INTRAVENOUS

## 2021-10-19 MED ORDER — BUPIVACAINE HCL (PF) 0.25 % IJ SOLN
INTRAMUSCULAR | Status: DC | PRN
  Administered 2021-10-19: 30 mL

## 2021-10-19 MED ORDER — MIDAZOLAM HCL 2 MG/2ML IJ SOLN
INTRAMUSCULAR | Status: AC
  Filled 2021-10-19: qty 2

## 2021-10-19 MED ORDER — ACETAMINOPHEN 325 MG PO TABS: 325.0000 mg | ORAL_TABLET | ORAL | Status: DC | PRN

## 2021-10-19 MED ORDER — BUPIVACAINE HCL (PF) 0.25 % IJ SOLN
INTRAMUSCULAR | Status: AC
  Filled 2021-10-19: qty 30

## 2021-10-19 MED ORDER — LACTATED RINGERS IV SOLN: INTRAVENOUS | Status: DC

## 2021-10-19 MED ORDER — SENNOSIDES-DOCUSATE SODIUM 8.6-50 MG PO TABS: 1.0000 | ORAL_TABLET | Freq: Every evening | ORAL | Status: DC | PRN

## 2021-10-19 MED ORDER — ACETAMINOPHEN 10 MG/ML IV SOLN
INTRAVENOUS | Status: AC
  Filled 2021-10-19: qty 100

## 2021-10-19 MED ORDER — OXYCODONE HCL 5 MG PO TABS
ORAL_TABLET | ORAL | Status: AC
  Filled 2021-10-19: qty 1

## 2021-10-19 MED ORDER — DEXAMETHASONE SODIUM PHOSPHATE 10 MG/ML IJ SOLN
INTRAMUSCULAR | Status: AC
  Filled 2021-10-19: qty 1

## 2021-10-19 MED ORDER — MORPHINE SULFATE (PF) 2 MG/ML IV SOLN: 0.5000 mg | INTRAVENOUS | Status: DC | PRN

## 2021-10-19 SURGICAL SUPPLY — 100 items
ANCHOR BUTTON TIGHTROPE II FT (Anchor) ×2 IMPLANT
ANCHOR BUTTON TIGHTROPE II OP (Anchor) ×2 IMPLANT
ANCHOR BUTTON TIGHTROPE RN 14 (Anchor) ×2 IMPLANT
ANCHOR PEEK 4.75X19.1 SWLK C (Anchor) ×2 IMPLANT
ANCHOR SUT MENISCAL STR 2-0 (Anchor) ×2 IMPLANT
BANDAGE ESMARK 6X9 LF (GAUZE/BANDAGES/DRESSINGS) ×1 IMPLANT
BLADE EXCALIBUR 5.0X13 (MISCELLANEOUS) ×2 IMPLANT
BLADE SURG 15 STRL LF DISP TIS (BLADE) ×1 IMPLANT
BLADE SURG 15 STRL SS (BLADE) ×1
BLADE SURG SZ10 CARB STEEL (BLADE) ×2 IMPLANT
BNDG ELASTIC 6X15 VLCR STRL LF (GAUZE/BANDAGES/DRESSINGS) ×1 IMPLANT
BNDG ELASTIC 6X5.8 VLCR STR LF (GAUZE/BANDAGES/DRESSINGS) ×2 IMPLANT
BNDG ESMARK 6X9 LF (GAUZE/BANDAGES/DRESSINGS) ×2
BNDG GAUZE ELAST 4 BULKY (GAUZE/BANDAGES/DRESSINGS) ×2 IMPLANT
BURR OVAL 8 FLU 4.0X13 (MISCELLANEOUS) ×2 IMPLANT
BUTTON EXT TIGHTROPE 5X20 (Orthopedic Implant) ×1 IMPLANT
CANNULA TWIST IN 6X9CM RED TRO (CANNULA) ×1 IMPLANT
CANNULA TWIST IN 8.25X9CM (CANNULA) ×1 IMPLANT
CLSR STERI-STRIP ANTIMIC 1/2X4 (GAUZE/BANDAGES/DRESSINGS) ×2 IMPLANT
COVER BACK TABLE 60X90IN (DRAPES) ×2 IMPLANT
CUFF TOURN SGL QUICK 34 (TOURNIQUET CUFF) ×2
CUFF TRNQT CYL 34X4.125X (TOURNIQUET CUFF) ×1 IMPLANT
CUFF TRNQT CYL 34X4X40X1 (TOURNIQUET CUFF) ×1 IMPLANT
CUTTER TENSIONER SUT 2-0 0 FBW (INSTRUMENTS) ×1 IMPLANT
DRAPE ARTHROSCOPY W/POUCH 114 (DRAPES) ×2 IMPLANT
DRAPE INCISE IOBAN 66X45 STRL (DRAPES) ×2 IMPLANT
DRAPE OEC MINIVIEW 54X84 (DRAPES) ×2 IMPLANT
DRAPE SHEET LG 3/4 BI-LAMINATE (DRAPES) ×2 IMPLANT
DRAPE U-SHAPE 47X51 STRL (DRAPES) ×2 IMPLANT
DRILL FLIPCUTTER II 10MM (CUTTER) IMPLANT
DRSG PAD ABDOMINAL 8X10 ST (GAUZE/BANDAGES/DRESSINGS) ×4 IMPLANT
DURAPREP 26ML APPLICATOR (WOUND CARE) ×2 IMPLANT
DW OUTFLOW CASSETTE/TUBE SET (MISCELLANEOUS) ×2 IMPLANT
ELECT NDL BLADE 2-5/6 (NEEDLE) IMPLANT
ELECT NEEDLE BLADE 2-5/6 (NEEDLE) IMPLANT
ELECT REM PT RETURN 15FT ADLT (MISCELLANEOUS) ×2 IMPLANT
EXCALIBUR 3.8MM X 13CM (MISCELLANEOUS) ×3 IMPLANT
FIBERSTICK 2 (SUTURE) ×2 IMPLANT
FLIPCUTTER II 10MM (CUTTER) ×2
GAUZE 4X4 16PLY ~~LOC~~+RFID DBL (SPONGE) ×2 IMPLANT
GAUZE SPONGE 4X4 12PLY STRL (GAUZE/BANDAGES/DRESSINGS) ×3 IMPLANT
GAUZE XEROFORM 1X8 LF (GAUZE/BANDAGES/DRESSINGS) ×2 IMPLANT
GAUZE XEROFORM 5X9 LF (GAUZE/BANDAGES/DRESSINGS) ×1 IMPLANT
GLOVE SRG 8 PF TXTR STRL LF DI (GLOVE) ×1 IMPLANT
GLOVE SURG ENC MOIS LTX SZ7 (GLOVE) ×2 IMPLANT
GLOVE SURG ENC MOIS LTX SZ8 (GLOVE) ×2 IMPLANT
GLOVE SURG UNDER POLY LF SZ7.5 (GLOVE) ×2 IMPLANT
GLOVE SURG UNDER POLY LF SZ8 (GLOVE) ×1
GOWN STRL REUS W/TWL XL LVL3 (GOWN DISPOSABLE) ×4 IMPLANT
GRAFT TISS 65-80 FRZN TENDON (Tissue) IMPLANT
IMPL FIBER TAK BICEPS (Miscellaneous) IMPLANT
IMPL FIBERSTICH 2-0 CVD (Anchor) IMPLANT
IMPLANT FIBER TAK BICEPS (Miscellaneous) ×2 IMPLANT
IMPLANT FIBERSTICH 2-0 CVD (Anchor) ×2 IMPLANT
IV NS IRRIG 3000ML ARTHROMATIC (IV SOLUTION) ×8 IMPLANT
JUGGERSTITCH IMPLANT CVD (Miscellaneous) ×2 IMPLANT
JUGGERSTITCH IMPLANT STRAIGHT (Miscellaneous) ×5 IMPLANT
KIT BASIN OR (CUSTOM PROCEDURE TRAY) ×2 IMPLANT
KIT BIO-TENODESIS 3X8 DISP (MISCELLANEOUS) ×1
KIT INSRT BABSR STRL DISP BTN (MISCELLANEOUS) ×1 IMPLANT
KIT TURNOVER KIT A (KITS) IMPLANT
MANIFOLD NEPTUNE II (INSTRUMENTS) ×2 IMPLANT
NEEDLE HYPO 22GX1.5 SAFETY (NEEDLE) IMPLANT
PACK ARTHROSCOPY DSU (CUSTOM PROCEDURE TRAY) ×2 IMPLANT
PENCIL SMOKE EVACUATOR (MISCELLANEOUS) IMPLANT
PORT APPOLLO RF 90DEGREE MULTI (SURGICAL WAND) ×2 IMPLANT
PORTAL SKID DEVICE (INSTRUMENTS) ×1 IMPLANT
SET PAD KNEE POSITIONER (MISCELLANEOUS) ×2 IMPLANT
SPONGE T-LAP 4X18 ~~LOC~~+RFID (SPONGE) ×2 IMPLANT
STRIP CLOSURE SKIN 1/2X4 (GAUZE/BANDAGES/DRESSINGS) IMPLANT
SUCTION FRAZIER HANDLE 10FR (MISCELLANEOUS) ×1
SUCTION TUBE FRAZIER 10FR DISP (MISCELLANEOUS) ×1 IMPLANT
SUT 2 FIBERLOOP 20 STRT BLUE (SUTURE)
SUT ETHILON 4 0 PS 2 18 (SUTURE) ×2 IMPLANT
SUT FIBERSNARE 2 CLSD LOOP (SUTURE) ×4 IMPLANT
SUT FIBERWIRE #2 38 REV NDL BL (SUTURE)
SUT FIBERWIRE #2 38 T-5 BLUE (SUTURE) ×2
SUT MNCRL AB 3-0 PS2 18 (SUTURE) ×3 IMPLANT
SUT VIC AB 0 CT2 27 (SUTURE) ×4 IMPLANT
SUT VIC AB 2-0 CT2 27 (SUTURE) ×4 IMPLANT
SUT VIC AB 2-0 SH 27 (SUTURE) ×2
SUT VIC AB 2-0 SH 27XBRD (SUTURE) ×2 IMPLANT
SUTURE 2 FIBERLOOP 20 STRT BLU (SUTURE) IMPLANT
SUTURE FIBERWR #2 38 T-5 BLUE (SUTURE) ×1 IMPLANT
SUTURE FIBERWR#2 38 REV NDL BL (SUTURE) IMPLANT
SUTURE TAPE TIGERLINK 1.3MM BL (SUTURE) ×1 IMPLANT
SUTURE TIGERSTICK 2 TIGERWIR 2 (MISCELLANEOUS) ×1 IMPLANT
SUTURETAPE TIGERLINK 1.3MM BL (SUTURE) ×2
SYR BULB IRRIG 60ML STRL (SYRINGE) ×1 IMPLANT
SYR CONTROL 10ML LL (SYRINGE) ×2 IMPLANT
SYS INTERNAL BRACE KNEE (Miscellaneous) ×2 IMPLANT
SYSTEM FBRTK BICEPS 1.9 DRILL (Anchor) ×1 IMPLANT
SYSTEM INTERNAL BRACE KNEE (Miscellaneous) IMPLANT
TIGERSTICK 2 TIGERWIRE 2 (MISCELLANEOUS) ×2
TISSUE GRAFTLINK 65-95MML (Tissue) ×4 IMPLANT
TOWEL OR 17X26 10 PK STRL BLUE (TOWEL DISPOSABLE) ×2 IMPLANT
TUBING ARTHROSCOPY IRRIG 16FT (MISCELLANEOUS) ×2 IMPLANT
TUBING CONNECTING 10 (TUBING) ×4 IMPLANT
WAND APOLLORF SJ50 AR-9845 (SURGICAL WAND) ×3 IMPLANT
WATER STERILE IRR 500ML POUR (IV SOLUTION) ×2 IMPLANT

## 2021-10-19 NOTE — Anesthesia Procedure Notes (Signed)
Anesthesia Regional Block: Adductor canal block   Pre-Anesthetic Checklist: , timeout performed,  Correct Patient, Correct Site, Correct Laterality,  Correct Procedure, Correct Position, site marked,  Risks and benefits discussed,  Surgical consent,  Pre-op evaluation,  At surgeon's request and post-op pain management  Laterality: Right  Prep: chloraprep       Needles:  Injection technique: Single-shot  Needle Type: Echogenic Stimulator Needle     Needle Length: 5cm  Needle Gauge: 22     Additional Needles:   Procedures:, nerve stimulator,,, ultrasound used (permanent image in chart),,    Narrative:  Start time: 10/19/2021 7:00 AM End time: 10/19/2021 7:05 AM Injection made incrementally with aspirations every 5 mL.  Performed by: Personally  Anesthesiologist: Bethena Midget, MD  Additional Notes: Functioning IV was confirmed and monitors were applied.  A 38mm 22ga Arrow echogenic stimulator needle was used. Sterile prep and drape,hand hygiene and sterile gloves were used. Ultrasound guidance: relevant anatomy identified, needle position confirmed, local anesthetic spread visualized around nerve(s)., vascular puncture avoided.  Image printed for medical record. Negative aspiration and negative test dose prior to incremental administration of local anesthetic. The patient tolerated the procedure well.

## 2021-10-19 NOTE — H&P (Signed)
Tiffany [name] is an 30 y.o. female.   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 She unfortunately suffered a lateral knee dislocation. She was taken to the ER. They were able to set her knee. She was seen in the office after an MRI scan was done. She has a big multiligamentous injury to her knee. She has a ACL, PCL, MCL and medial and lateral meniscal tear.   Past Medical History:  Diagnosis Date   ADHD (attention deficit hyperactivity disorder)    Allergic rhinitis    Allergy    Anxiety    Depression    Heart murmur    Migraine with aura    Mononucleosis    30 yo    Past Surgical History:  Procedure Laterality Date   WISDOM TOOTH EXTRACTION      Family History  Problem Relation Age of Onset   Clotting disorder Mother        has had frequent DVTs   Colon polyps Mother        was told they were cancerous   Healthy Father    Healthy Sister    Migraines Maternal Grandmother    Healthy Sister    Pancreatic cancer Cousin        second cousin   Breast cancer Neg Hx    Ovarian cancer Neg Hx    Social History:  reports that she has quit smoking. Her smoking use included cigarettes. She has never used smokeless tobacco. She reports current alcohol use of about 1.0 standard drink per week. She reports that she does not use drugs.  Allergies:  Allergies  Allergen Reactions   Penicillins Rash    Medications Prior to Admission  Medication Sig Dispense Refill   cetirizine (ZYRTEC) 10 MG tablet Take 10 mg by mouth daily.     Echinacea 125 MG CAPS Take 1-2 capsules by mouth daily.     HYDROcodone-acetaminophen (NORCO/VICODIN) 5-325 MG tablet Take 1 tablet by mouth every 6 (six) hours as needed for severe pain. 15 tablet 0   Melatonin 10 MG TABS Take 10 mg by mouth at bedtime as needed (sleep).     methylphenidate (RITALIN LA) 10 MG 24 hr capsule Take 20 mg by mouth daily.     PEPPERMINT OIL PO Take 1 capsule by mouth  daily.     Probiotic Product (PROBIOTIC DAILY PO) Take 2 capsules by mouth daily.     sertraline (ZOLOFT) 50 MG tablet Take 100 mg by mouth daily.     cyclobenzaprine (FLEXERIL) 5 MG tablet Take 1 tablet (5 mg total) by mouth 3 (three) times daily as needed for muscle spasms. 30 tablet 1   QUEtiapine (SEROQUEL) 25 MG tablet Take 25 mg by mouth at bedtime.     SUMAtriptan (IMITREX) 50 MG tablet Take 1 tablet (50 mg total) by mouth every 2 (two) hours as needed for migraine. May repeat in 2 hours if headache persists or recurs. 10 tablet 1    Results for orders placed or performed during the hospital encounter of 10/17/21 (from the past 48 hour(s))  SARS CORONAVIRUS 2 (TAT 6-24 HRS) Nasopharyngeal Nasopharyngeal Swab     Status: None   Collection Time: 10/17/21 10:42 AM   Specimen: Nasopharyngeal Swab  Result Value Ref Range   SARS Coronavirus 2 NEGATIVE NEGATIVE    Comment: (NOTE) SARS-CoV-2 target nucleic acids are NOT DETECTED.  The SARS-CoV-2 RNA is generally detectable in  upper and lower respiratory specimens during the acute phase of infection. Negative results do not preclude SARS-CoV-2 infection, do not rule out co-infections with other pathogens, and should not be used as the sole basis for treatment or other patient management decisions. Negative results must be combined with clinical observations, patient history, and epidemiological information. The expected result is Negative.  Fact Sheet for Patients: HairSlick.no  Fact Sheet for Healthcare Providers: quierodirigir.com  This test is not yet approved or cleared by the [place] FDA and  has been authorized for detection and/or diagnosis of SARS-CoV-2 by FDA under an Emergency Use Authorization (EUA). This EUA will remain  in effect (meaning this test can be used) for the duration of the COVID-19 declaration under Se ction 564(b)(1) of the Act, 21 U.S.C. section  360bbb-3(b)(1), unless the authorization is terminated or revoked sooner.  Performed at [facility], [address] [place], [place] [address]    No results found.  Review of Systems  All other systems reviewed and are negative.  Blood pressure (!) 149/94, pulse 80, temperature 97.7 F (36.5 C), temperature source Oral, resp. rate 16, height 5\' 1"  (1.549 m), weight 83 kg, last menstrual period 09/22/2021, SpO2 99 %. Physical Exam Constitutional:      Appearance: Normal appearance. She is normal weight.  HENT:     Head: Normocephalic and atraumatic.     Mouth/Throat:     Mouth: Mucous membranes are moist.     Pharynx: Oropharynx is clear.  Eyes:     Extraocular Movements: Extraocular movements intact.  Musculoskeletal:     Cervical back: Normal range of motion and neck supple.     Comments: Extreme laxity to right knee on medial side + anterior and posterior drawer Swelling noted  Skin:    General: Skin is warm and dry.     Capillary Refill: Capillary refill takes less than 2 seconds.  Neurological:     General: No focal deficit present.     Mental Status: She is alert and oriented to person, place, and time.  Psychiatric:        Mood and Affect: Mood normal.        Behavior: Behavior normal.        Thought Content: Thought content normal.        Judgment: Judgment normal.     Assessment/Plan Multiligamentous injury to right knee: -ACL, PCL, MCL medial and lateral meniscal tear. Surgical repair was discussed with patient in the office. Risks and benefits of surgery were discussed with patient. She has elected to proceed with surgical management at this time.  -She will be having a right knee arthroscopy EUS, Partial lateral menisectomy vs repair, Allograft ACL, PCL reconstruction MCL reconstruction.  -She will follow up in the office in a few days  09/24/2021, Cherie Dark [facility] [phone] 10/19/2021, 7:15 AM

## 2021-10-19 NOTE — Anesthesia Procedure Notes (Signed)
Procedure Name: LMA Insertion Date/Time: 10/19/2021 8:16 AM Performed by: [place], Kue Fox C, CRNA Pre-anesthesia Checklist: Patient identified, Emergency Drugs available, Suction available and Patient being monitored Patient Re-evaluated:Patient Re-evaluated prior to induction Oxygen Delivery Method: Circle system utilized Preoxygenation: Pre-oxygenation with 100% oxygen Induction Type: IV induction Ventilation: Mask ventilation without difficulty LMA: LMA inserted LMA Size: 4.0 Number of attempts: 1 Airway Equipment and Method: Bite block Placement Confirmation: positive ETCO2 Tube secured with: Tape Dental Injury: Teeth and Oropharynx as per pre-operative assessment

## 2021-10-19 NOTE — Anesthesia Postprocedure Evaluation (Signed)
Anesthesia Post Note  Patient: Henley Blyth  Procedure(s) Performed: KNEE ARTHROSCOPY EVALUATION UNDER ANESTHESIA, PARTIAL LATERAL MENISECTOMY ,  ALLOGRAFT ANTERIOR CRUCIATE LIGAMENT RECONSTRUCTION, POSTERIOR CRUCIATE LIGAMENT RECONSTRUCTION, MEDIAL COLLATERAL LIGAMENT RECONSTRUCTION (Right: Knee)     Patient location during evaluation: PACU Anesthesia Type: General Level of consciousness: awake and alert Pain management: pain level controlled Vital Signs Assessment: post-procedure vital signs reviewed and stable Respiratory status: spontaneous breathing, nonlabored ventilation, respiratory function stable and patient connected to nasal cannula oxygen Cardiovascular status: blood pressure returned to baseline and stable Postop Assessment: no apparent nausea or vomiting Anesthetic complications: no   No notable events documented.  Last Vitals:  Vitals:   10/19/21 1545 10/19/21 1600  BP: 115/88 117/83  Pulse: (!) 108 (!) 102  Resp: 13 16  Temp: 36.4 C 36.6 C  SpO2: 100% 100%    Last Pain:  Vitals:   10/19/21 1600  TempSrc: Oral  PainSc:                  Doy Taaffe

## 2021-10-19 NOTE — Transfer of Care (Signed)
Immediate Anesthesia Transfer of Care Note  Patient: Tiffany [name]  Procedure(s) Performed: KNEE ARTHROSCOPY EVALUATION UNDER ANESTHESIA, PARTIAL LATERAL MENISECTOMY ,  ALLOGRAFT ANTERIOR CRUCIATE LIGAMENT RECONSTRUCTION, POSTERIOR CRUCIATE LIGAMENT RECONSTRUCTION, MEDIAL COLLATERAL LIGAMENT RECONSTRUCTION (Right: Knee)  Patient Location: PACU  Anesthesia Type:General  Level of Consciousness: awake and drowsy  Airway & Oxygen Therapy: Patient Spontanous Breathing and Patient connected to face mask oxygen  Post-op Assessment: Report given to RN and Post -op Vital signs reviewed and stable  Post vital signs: Reviewed and stable  Last Vitals:  Vitals Value Taken Time  BP 118/63 10/19/21 1306  Temp    Pulse 85 10/19/21 1310  Resp 7 10/19/21 1310  SpO2 100 % 10/19/21 1310  Vitals shown include unvalidated device data.  Last Pain:  Vitals:   10/19/21 0616  TempSrc:   PainSc: 0-No pain      Patients Stated Pain Goal: 3 (10/19/21 0272)  Complications: No notable events documented.

## 2021-10-19 NOTE — Op Note (Signed)
Preop diagnosis #1 right knee torn posterior cruciate ligament #2 torn anterior cruciate ligament #3 torn medial collateral ligament #4 torn lateral meniscus Postop diagnosis 1 right knee posterior cruciate ligament tear #2 anterior cruciate ligament tear #3 medial collateral ligament tear #4 anterior horn bucket-handle tear lateral meniscus #5 complete bucket-handle tear avulsion of the medial meniscus. .  Postop diagnosis #1 right knee arthroscopic assisted allograft posterior cruciate ligament reconstruction #2 arthroscopic assisted allograft anterior cruciate ligament reconstruction #3 medial collateral ligament open repair and augmentation with internal brace #4 arthroscopic medial meniscus repair #5 arthroscopic lateral meniscus repair. Surgeon Valma Cava, MD Assistant Harl Favor, PA-C Anesthesia abductor block with general estimated blood loss minimal drains none complications none tourniquet time 1 hour and 59 minutes at 300 mmHg Disposition PACU stable Operative details Patient was counseled in the holding area correct side marked and signed appropriately.  IV antibiotics were given within 1 hour of the surgical incision time.  Block was administered per anesthesiologist.  TED hose applied to uninvolved leg.  At this time in the operating room the grafts were prepared chosen Arthrex LifeNet semitendinosus 4 bundle posterior crucial ligament allograft with internal brace Arthrex LifeNet semitendinosus internal brace anterior cruciate ligament allograft after graft preparation was patient was brought into the room placed under general anesthesia right lower extremity elevated meticulously handled.  Exam revealed range of motion 0 to 60 degrees actively patient it was 0 to 120 degrees the MCL showed 5 mm opening full extension and 15 mm opening at 30 degrees of flexion.  Postop: Was stable.  She had abrasions anteriorly that were immediately taken/scrubbed and cleansed separated and surgical  incision site is much possible.  Foley cath was placed by the OR circulating nurse due to the expected time of the case right lower extremity elevated prepped with DuraPrep and draped in sterile fashion [place]an was utilized.  After timeout tourniquet was inflated to 300 mmHg.  Inferomedial inferolateral portals are direct placed scope was introduced complete rupture PCL ACL was not as noted ACL PCL fibers were debrided with a shaver the walls were cleaned with cautery.  Posterior medial portal was made to submit the posterior resection the capsule that was scar fat was gently lifted off the bone and to close fashion with blunt dissection.  After identifying the anatomic insertion of the PCL with 22 mamillary bodies #10 subjective the menisci medial meniscus showed a large bucket-handle tear of the medial meniscus complete avulsion at the red red zone at the meniscal capsular junction it was reduced there was plastic deformation present was reduced anatomically as possible repair remove Biomet Zimmer and all inside meniscal the anchors.  Lateral side was inspected there was anterior horn maceration of the lateral meniscus with anterior horn bucket-handle tear which was completely repairable was maceration complex of the he was debrided of her stress half was then particularly fixed with more meniscal all inside anchors.  Following the lateral meniscus posterior aspect repair construct at the notch the Arthrex posterior cruciate ligament guide was placed in the intercondylar notch 70 degree scope was utilized.  Confirm the was placed directly ACL and posterior crucial ligament footprint posterior soft tissues have been meticulously handled the whole case.  Small incision made anterior medial tibia the guide was set for 70 degrees and drilled to the posterior cortex and came and bluntly cannula by [place].  On the left the graft the reamer flip cutter was then placed open fashion and retrosocket was performed.  Debris  was  removed with a shaver.  FiberWire suture passed in place.  Tibial the femoral guide was then utilized on the femur the anatomic insertion site the PCL was noted on the anterior superior aspect of the the medial wall with placed the guide in excellent position from a small stab wound medially and the flip cutter was permanent into the anatomical posterior crucial ligament insertion site and regular socket form FiberWire suture passed in place.  Tendon was then rasped nicely cemented.  The graft was then delivered into the knee he was placed through the inferomedial portal into the femoral socket to be seen reduced anatomically by pulling on the graft and it was securely fixed on the femoral socket with a another button.  This was tensioned at 90 degrees of flexion reducing the tibia anatomically.  At this time the tourniquet was deflated at 1 hour and 59 minutes tension directed the PCL ACL notchplasty performed over-the-top position was confirmed femoral guide was placed small stab was made laterally and a Arthrex flip cutter was put into the posterior anterior crucial ligament insertion site on the femur Retrosocket was performed FiberWire suture passed in place debris was removed and the tunnel was rasped.  Tibial [place] into the anatomic ACL footprint make sure we put the guide away from the posterior cruciate ligament the gap he was placed to the anatomic ACL footprint and the retrosocket form FiberWire suture passed in place.  Debris was removed the graft and delivered to the knee on the femoral side the graft placed in the femoral socket in a retrograde fashion tibial socket antegrade fashion we also tightened internal brace in the PCL line with flexion but did not tolerate marked and PCL down yet on the femoral side attention directed back to the ACL he was securely placed into the tibial socket appropriate depth with the tibia reduced anatomically with good tension on both sides fixed on the tibial side  with a button and on the femoral side of the external button range of motion was critical and excellent knee was stable.  Attention directed to the lateral medial side examiners support extension she had 5 mm of opening at 30 with the flexion she had 10 mm of moderate endpoint 30 look meniscal repair of the MCL possible medial incision made appropriate level subcu tissue was opened.  The layer 1 was opened and scar fat the PCL was identified and later to it was more it was mobilized distally and then up to Arthrex suture anchor placed into the medial femoral condyle and the MCL with block proximal and anterior reducing the present time position side to side multiple suture tied down.  Internal brace in place to place.  Very pleased with reconstruction and internal brace fixation.  Knee was ranged numerous purposes-range of motion stability wounds were irrigated. Closure using to be stabilized PACU and kept overnight for pain control.  Tell patient positioning prepping draping technical surgical assistant entire case wound closure application dressing and splint and suture management and traction of the neurovascular structures that are critical mostly in hospital assistance was needed.  Taken and subcu at the inferomedial anteromedial portal site and

## 2021-10-19 NOTE — Plan of Care (Signed)
  Problem: Activity: Goal: Risk for activity intolerance will decrease Outcome: Progressing   Problem: Pain Managment: Goal: General experience of comfort will improve Outcome: Progressing   Problem: Safety: Goal: Ability to remain free from injury will improve Outcome: Progressing   

## 2021-10-20 DIAGNOSIS — S83511A Sprain of anterior cruciate ligament of right knee, initial encounter: Secondary | ICD-10-CM | POA: Diagnosis not present

## 2021-10-20 LAB — HIV ANTIBODY (ROUTINE TESTING W REFLEX): HIV Screen 4th Generation wRfx: NONREACTIVE

## 2021-10-20 MED ORDER — LORATADINE 10 MG PO TABS
10.0000 mg | ORAL_TABLET | Freq: Every day | ORAL | Status: DC
  Administered 2021-10-20: 10 mg via ORAL
  Filled 2021-10-20: qty 1

## 2021-10-20 MED ORDER — RIVAROXABAN 10 MG PO TABS: 10.0000 mg | ORAL_TABLET | Freq: Every day | ORAL | 0 refills | Status: AC

## 2021-10-20 MED ORDER — HYDROMORPHONE HCL 2 MG PO TABS: 2.0000 mg | ORAL_TABLET | Freq: Four times a day (QID) | ORAL | 0 refills | Status: AC | PRN

## 2021-10-20 MED ORDER — OXYCODONE HCL 5 MG PO TABS: 5.0000 mg | ORAL_TABLET | ORAL | 0 refills | Status: AC | PRN

## 2021-10-20 MED ORDER — METHOCARBAMOL 500 MG PO TABS: 500.0000 mg | ORAL_TABLET | Freq: Four times a day (QID) | ORAL | 0 refills | Status: AC

## 2021-10-20 MED ORDER — ONDANSETRON HCL 4 MG PO TABS: 4.0000 mg | ORAL_TABLET | Freq: Every day | ORAL | 1 refills | Status: AC | PRN

## 2021-10-20 NOTE — Progress Notes (Signed)
Subjective: 1 Day Post-Op Procedure(s) (LRB): KNEE ARTHROSCOPY EVALUATION UNDER ANESTHESIA, PARTIAL LATERAL MENISECTOMY ,  ALLOGRAFT ANTERIOR CRUCIATE LIGAMENT RECONSTRUCTION, POSTERIOR CRUCIATE LIGAMENT RECONSTRUCTION, MEDIAL COLLATERAL LIGAMENT RECONSTRUCTION (Right) Patient reports pain as moderate.  She has been getting relief with PO pain meds No events overnight No N/V, CP/SOB  Objective: Vital signs in last 24 hours: Temp:  [97.4 F (36.3 C)-98.6 F (37 C)] 98.4 F (36.9 C) (10/27 0507) Pulse Rate:  [74-128] 81 (10/27 0507) Resp:  [7-16] 16 (10/27 0507) BP: (111-127)/(63-88) 116/73 (10/27 0507) SpO2:  [99 %-100 %] 100 % (10/27 0507)  Intake/Output from previous day: 10/26 0701 - 10/27 0700 In: 2458.9 [P.O.:720; I.V.:1588.9; IV Piggyback:150] Out: 1875 [Urine:1850; Blood:25] Intake/Output this shift: No intake/output data recorded.  Recent Labs    10/19/21 1643  HGB 12.6   Recent Labs    10/19/21 1643  WBC 10.0  RBC 4.29  HCT 38.3  PLT 253   Recent Labs    10/19/21 1643  CREATININE 0.76   No results for input(s): LABPT, INR in the last 72 hours.  Neurologically intact Neurovascular intact Sensation intact distally Intact pulses distally Dorsiflexion/Plantar flexion intact Incision: dressing C/D/I Compartment soft   Assessment/Plan: 1 Day Post-Op Procedure(s) (LRB): KNEE ARTHROSCOPY EVALUATION UNDER ANESTHESIA, PARTIAL LATERAL MENISECTOMY ,  ALLOGRAFT ANTERIOR CRUCIATE LIGAMENT RECONSTRUCTION, POSTERIOR CRUCIATE LIGAMENT RECONSTRUCTION, MEDIAL COLLATERAL LIGAMENT RECONSTRUCTION (Right) Up with therapy, TTWB Once she is cleared by PT okay to go home Xarelto for DVT ppx at home Meds sent to pharmacy Follow up in the office on Monday   Ceasar Mons [phone] 10/20/2021, 7:26 AM

## 2021-10-20 NOTE — Evaluation (Signed)
Physical Therapy Evaluation Patient Details Name: Tiffany [name] MRN: [id number] DOB: 1991/08/03 Today's Date: 10/20/2021  History of Present Illness  30 yo female s/p  R knee arthroscopic assisted allograft PCL reconstruction, arthroscopic assisted allograft ACL reconstruction, MCL open repair and augmentation with internal brace, arthroscopic medial meniscus repair, arthroscopic lateral meniscus repair on 10/26. PMH includes ADHD, anxiety, depression, migraines.  Clinical Impression   Pt presents with severe post-operative RLE pain, impaired RLE strength, impaired gait, and decreased activity tolerance vs baseline. Pt to benefit from acute PT to address deficits. Pt ambulated short hallway distance with both RW and axillary crutches, pt maintaining more NWB vs TTWB RLE and felt more steady with use of RW. Pt also procificently navigated steps with use of RW, handout administered and pt aware she needs caregiver assist to perform. Pt also aware she is to wear bledsoe brace locked in extension at all times. All PT education completed, no further acute PT needs at this time.         Recommendations for follow up therapy are one component of a multi-disciplinary discharge planning process, led by the attending physician.  Recommendations may be updated based on patient status, additional functional criteria and insurance authorization.  Follow Up Recommendations Follow physician's recommendations for discharge plan and follow up therapies (OPPT when cleared)    Assistance Recommended at Discharge Intermittent Supervision/Assistance  Functional Status Assessment Patient has had a recent decline in their functional status and demonstrates the ability to make significant improvements in function in a reasonable and predictable amount of time.  Equipment Recommendations  Rolling walker (2 wheels)    Recommendations for Other Services       Precautions / Restrictions Precautions Precautions:  Fall Required Braces or Orthoses: Knee Immobilizer - Right Knee Immobilizer - Right: On at all times;Other (comment) (locked in extension) Restrictions Weight Bearing Restrictions: Yes RLE Weight Bearing: Touchdown weight bearing      Mobility  Bed Mobility Overal bed mobility: Needs Assistance Bed Mobility: Supine to Sit;Sit to Supine     Supine to sit: Min assist Sit to supine: Min assist   General bed mobility comments: min assist for RLE lifting and translation to/from EOB, per pt her mother is able to help with this at home.    Transfers Overall transfer level: Needs assistance Equipment used: Rolling walker (2 wheels);Crutches Transfers: Sit to/from Stand Sit to Stand: Supervision           General transfer comment: for safety, safe with WB precautions and AD use    Ambulation/Gait Ambulation/Gait assistance: Supervision Gait Distance (Feet): 45 Feet (+20 ft with axillary crutches) Assistive device: Rolling walker (2 wheels);Crutches Gait Pattern/deviations: Step-to pattern;Antalgic;Decreased weight shift to right;Trunk flexed Gait velocity: decr   General Gait Details: supervision for safety, verbal cuing for TTWB though pt preferring to use NWB. Pt feels more steady on RW during gait.  Stairs Stairs: Yes Stairs assistance: Min assist Stair Management: No rails;Step to pattern;Backwards;With walker Number of Stairs: 2 General stair comments: assist to brace cross bar of RW during ascending/descending, cues for caregiver steadying RW, sequencing with maintaining TTWB precautions, and backwards ascent/descent. Handout given and reviewed with pt. Handout for bump up with w/c also provided.  Wheelchair Mobility    Modified Rankin (Stroke Patients Only)       Balance Overall balance assessment: Needs assistance Sitting-balance support: No upper extremity supported;Feet supported Sitting balance-Leahy Scale: Good     Standing balance support: Bilateral  upper extremity supported;During functional activity  Standing balance-Leahy Scale: Fair Standing balance comment: reliant on external support dynamically, can stand statically without support                             Pertinent Vitals/Pain Pain Assessment: 0-10 Pain Score: 8  Pain Location: R knee Pain Descriptors / Indicators: Tingling;Aching;Sore Pain Intervention(s): Limited activity within patient's tolerance;Monitored during session;Repositioned    Home Living Family/patient expects to be discharged to:: Private residence Living Arrangements: Alone Available Help at Discharge: Family;Available PRN/intermittently (will live with mother, who works. Per pt, she has a good support system and can have assist as needed) Type of Home: House Home Access: Stairs to enter Entrance Stairs-Rails: None Entrance Stairs-Number of Steps: 3+1 (or 2 steps)   Home Layout: One level Home Equipment: Rollator (4 wheels);Crutches;Wheelchair - manual      Prior Function Prior Level of Function : Independent/Modified Independent;Working/employed             Mobility Comments: pt has been using crutches, wheelchair, and rollator as rolling seat x1 month since injury. ADLs Comments: was doing ADLs for self for the past two weeks     Hand Dominance   Dominant Hand: Right    Extremity/Trunk Assessment   Upper Extremity Assessment Upper Extremity Assessment: Overall WFL for tasks assessed    Lower Extremity Assessment Lower Extremity Assessment: RLE deficits/detail RLE Deficits / Details: limited by post-op immobilization and pain, able to perform ankle pumps and partial SLR in bledsoe with PT lift assist RLE: Unable to fully assess due to immobilization;Unable to fully assess due to pain    Cervical / Trunk Assessment Cervical / Trunk Assessment: Normal  Communication   Communication: No difficulties  Cognition Arousal/Alertness: Awake/alert Behavior During Therapy: WFL  for tasks assessed/performed Overall Cognitive Status: Within Functional Limits for tasks assessed                                          General Comments      Exercises Other Exercises Other Exercises: PT reviewed importance of ice and elevation once d/c, as well as supervision for mobility. Pt states she has PRN help at home. PT encouraged pt NOT to use rollator at all given its instability especially with her WB precautions, pt expresses understanding.   Assessment/Plan    PT Assessment Patient does not need any further PT services;All further PT needs can be met in the next venue of care  PT Problem List Decreased strength;Decreased mobility;Decreased activity tolerance;Decreased balance;Decreased knowledge of use of DME;Pain       PT Treatment Interventions      PT Goals (Current goals can be found in the Care Plan section)  Acute Rehab PT Goals Patient Stated Goal: home today PT Goal Formulation: With patient Time For Goal Achievement: 10/20/21 Potential to Achieve Goals: Good    Frequency     Barriers to discharge        Co-evaluation               AM-PAC PT "6 Clicks" Mobility  Outcome Measure Help needed turning from your back to your side while in a flat bed without using bedrails?: A Little Help needed moving from lying on your back to sitting on the side of a flat bed without using bedrails?: A Little Help needed moving to and from a bed  to a chair (including a wheelchair)?: A Little Help needed standing up from a chair using your arms (e.g., wheelchair or bedside chair)?: A Little Help needed to walk in hospital room?: A Little Help needed climbing 3-5 steps with a railing? : A Little 6 Click Score: 18    End of Session Equipment Utilized During Treatment: Gait belt;Other (comment) (R bledsoe brace locked in extension) Activity Tolerance: Patient tolerated treatment well Patient left: in bed;with call bell/phone within  reach Nurse Communication: Mobility status      Time: 2542-7062 PT Time Calculation (min) (ACUTE ONLY): 42 min   Charges:   PT Evaluation $PT Eval Low Complexity: 1 Low PT Treatments $Gait Training: 23-37 mins        Stacie Glaze, PT DPT Acute Rehabilitation Services Pager [phone]  Office [phone]   [place] E Ruffin Pyo 10/20/2021, 10:54 AM

## 2021-10-20 NOTE — Plan of Care (Signed)
  Problem: Activity: Goal: Risk for activity intolerance will decrease Outcome: Progressing   Problem: Pain Managment: Goal: General experience of comfort will improve Outcome: Progressing   

## 2021-10-20 NOTE — TOC Transition Note (Signed)
Transition of Care [facility]) - CM/SW Discharge Note  Patient Details  Name: Tiffany [name] MRN: [id number] Date of Birth: April 19, 1991  [facility] of [facility]) CM/SW Contact:  Ewing Schlein, LCSW Phone Number: 10/20/2021, 2:44 PM  Clinical Narrative: Patient will need to discharge home with a rolling walker set up through patient's Worker's Comp case worker. CSW sent walker order and discharge summary to [employer] with [employer]. TOC signing off.  Final next level of care: Home/Self Care Barriers to Discharge: No Barriers Identified  Patient Goals and CMS Choice Choice offered to / list presented to : NA  Discharge Plan and Services         DME Arranged: Walker rolling DME Agency: Other - Comment (Set up through patient's Worker's Comp)  Readmission Risk Interventions No flowsheet data found.

## 2021-10-20 NOTE — Progress Notes (Signed)
Discharge package printed and instructions given to patient. Verbalizes understanding.  

## 2021-10-24 ENCOUNTER — Other Ambulatory Visit: Payer: Self-pay

## 2021-10-24 ENCOUNTER — Encounter (HOSPITAL_COMMUNITY): Payer: Self-pay | Admitting: Specialist

## 2021-10-28 NOTE — Discharge Summary (Signed)
Physician Discharge Summary  Patient ID: Tiffany [name] MRN: [id number] DOB/AGE: 1991-07-10 30 y.o.  Admit date: 10/19/2021 Discharge date: 10/28/2021  Admission Diagnoses: Right knee ACL, PCL, MCL ruptures and medial and lateral meniscus tears  Discharge Diagnoses:  Active Problems:   Rupture of anterior cruciate ligament of right knee, initial encounter   Discharged Condition: good  Hospital Course: Patient was admitted on 10/26 for a right knee arthroscopy, ACL and PCL reconstruction MCL repair, medial and lateral meniscus repairs. Patient tolerated surgery well. NVI after surgery and sent to PACU in stable conditions. She was taken up to the floor. No events overnight. She was able to get up with PT on PO day 1 with no issues. She was d/c home pain meds, Xarelto.   Consults: None  Significant Diagnostic Studies: none  Treatments: IV hydration, antibiotics: Ancef, analgesia: Vicodin, Dilaudid, and oxycodone, anticoagulation: Lovenox, therapies: PT, and surgery: right knee surgery.   Discharge Exam: Blood pressure 116/73, pulse 81, temperature 98.4 F (36.9 C), temperature source Oral, resp. rate 16, height 5\' 1"  (1.549 m), weight 83 kg, last menstrual period 09/22/2021, SpO2 100 %. General appearance: alert, cooperative, appears stated age, and no distress Extremities: extremities normal, atraumatic, no cyanosis or edema, Homans sign is negative, no sign of DVT, and no edema, redness or tenderness in the calves or thighs Patient was able to dorsiflex, plantar flex right ankle with no difficulty Pulses:  L brachial 2+ R brachial 2+  L radial 2+ R radial 2+  L inguinal 2+ R inguinal 2+  L popliteal 2+ R popliteal 2+  L posterior tibial 2+ R posterior tibial 2+  L dorsalis pedis 2+ R dorsalis pedis 2+   Skin: Skin color, texture, turgor normal. No rashes or lesions Neurologic: Grossly normal  Incision/Wound: dressings clean dry and intact  Disposition: Discharge disposition:  01-Home or Self Care       Discharge Instructions     Call MD / Call 911   Complete by: As directed    If you experience chest pain or shortness of breath, CALL 911 and be transported to the hospital emergency room.  If you develope a fever above 101 F, pus (white drainage) or increased drainage or redness at the wound, or calf pain, call your surgeon's office.   Constipation Prevention   Complete by: As directed    Drink plenty of fluids.  Prune juice may be helpful.  You may use a stool softener, such as Colace (over the counter) 100 mg twice a day.  Use MiraLax (over the counter) for constipation as needed.   Diet - low sodium heart healthy   Complete by: As directed    Discharge instructions   Complete by: As directed    Right  knee ACL, PCL reconstruction, MCL repair, Medial and lateral meniscal repair: -Take Xarelto daily for DVT ppx -Okay to take Tylenol 1000 mg 2-3 times a day with pain medication -Please ice and elevate the leg -hold on PT until seen back in office -Please follow-up in the office on Monday -Please take a stool softener while taking opioid pain medication -Do not get bandages wet   Increase activity slowly as tolerated   Complete by: As directed    Post-operative opioid taper instructions:   Complete by: As directed    POST-OPERATIVE OPIOID TAPER INSTRUCTIONS: It is important to wean off of your opioid medication as soon as possible. If you do not need pain medication after your surgery it is ok to  stop day one. Opioids include: Codeine, Hydrocodone(Norco, Vicodin), Oxycodone(Percocet, oxycontin) and hydromorphone amongst others.  Long term and even short term use of opiods can cause: Increased pain response Dependence Constipation Depression Respiratory depression And more.  Withdrawal symptoms can include Flu like symptoms Nausea, vomiting And more Techniques to manage these symptoms Hydrate well Eat regular healthy meals Stay active Use  relaxation techniques(deep breathing, meditating, yoga) Do Not substitute Alcohol to help with tapering If you have been on opioids for less than two weeks and do not have pain than it is ok to stop all together.  Plan to wean off of opioids This plan should start within one week post op of your joint replacement. Maintain the same interval or time between taking each dose and first decrease the dose.  Cut the total daily intake of opioids by one tablet each day Next start to increase the time between doses. The last dose that should be eliminated is the evening dose.      Walker rolling   Complete by: As directed       Allergies as of 10/20/2021       Reactions   Penicillins Rash        Medication List     STOP taking these medications    HYDROcodone-acetaminophen 5-325 MG tablet Commonly known as: NORCO/VICODIN       TAKE these medications    cetirizine 10 MG tablet Commonly known as: ZYRTEC Take 10 mg by mouth daily.   cyclobenzaprine 5 MG tablet Commonly known as: FLEXERIL Take 1 tablet (5 mg total) by mouth 3 (three) times daily as needed for muscle spasms.   Echinacea 125 MG Caps Take 1-2 capsules by mouth daily.   Melatonin 10 MG Tabs Take 10 mg by mouth at bedtime as needed (sleep).   methocarbamol 500 MG tablet Commonly known as: Robaxin Take 1 tablet (500 mg total) by mouth 4 (four) times daily.   methylphenidate 10 MG 24 hr capsule Commonly known as: RITALIN LA Take 20 mg by mouth daily.   ondansetron 4 MG tablet Commonly known as: Zofran Take 1 tablet (4 mg total) by mouth daily as needed for nausea or vomiting.   PEPPERMINT OIL PO Take 1 capsule by mouth daily.   PROBIOTIC DAILY PO Take 2 capsules by mouth daily.   QUEtiapine 25 MG tablet Commonly known as: SEROQUEL Take 25 mg by mouth at bedtime.   rivaroxaban 10 MG Tabs tablet Commonly known as: XARELTO Take 1 tablet (10 mg total) by mouth daily for 21 days.   sertraline 50  MG tablet Commonly known as: ZOLOFT Take 100 mg by mouth daily.   SUMAtriptan 50 MG tablet Commonly known as: Imitrex Take 1 tablet (50 mg total) by mouth every 2 (two) hours as needed for migraine. May repeat in 2 hours if headache persists or recurs.       ASK your doctor about these medications    HYDROmorphone 2 MG tablet Commonly known as: Dilaudid Take 1 tablet (2 mg total) by mouth every 6 (six) hours as needed for up to 7 days for severe pain. Ask about: Should I take this medication?   oxyCODONE 5 MG immediate release tablet Commonly known as: Roxicodone Take 1 tablet (5 mg total) by mouth every 4 (four) hours as needed for up to 7 days for moderate pain. Ask about: Should I take this medication?         SignedCherie Dark 10/28/2021, 8:20 AM

## 2024-06-27 ENCOUNTER — Encounter: Payer: Self-pay | Admitting: Emergency Medicine

## 2024-06-27 ENCOUNTER — Ambulatory Visit: Admission: EM | Admit: 2024-06-27 | Discharge: 2024-06-27 | Disposition: A | Discharge status: Home / Self Care

## 2024-06-27 DIAGNOSIS — J020 Streptococcal pharyngitis: Secondary | ICD-10-CM

## 2024-06-27 LAB — POCT RAPID STREP A (OFFICE): Rapid Strep A Screen: POSITIVE — AB

## 2024-06-27 MED ORDER — CEPHALEXIN 500 MG PO CAPS: 500.0000 mg | ORAL_CAPSULE | Freq: Two times a day (BID) | ORAL | 0 refills | Status: AC

## 2024-06-27 MED ORDER — CEPHALEXIN 500 MG PO CAPS: 500.0000 mg | ORAL_CAPSULE | Freq: Two times a day (BID) | ORAL | 0 refills | Status: DC

## 2024-06-27 NOTE — Discharge Instructions (Signed)
  1. Strep pharyngitis (Primary) - POCT rapid strep A performed in UC is positive for strep pharyngitis - cephALEXin  (KEFLEX ) 500 MG capsule; Take 1 capsule (500 mg total) by mouth 2 (two) times daily for 10 days.  Dispense: 20 capsule; Refill: 0 - Take ibuprofen 600 to 800 mg every 6-8 hours as needed for throat inflammation and pain. - Use of over-the-counter sore throat lozenges or cough drops may help decrease symptoms and soothe throat. - Continue to monitor symptoms for any change in severity if there is any escalation of current symptoms or development of new symptoms follow-up for further evaluation and management.

## 2024-06-27 NOTE — ED Triage Notes (Signed)
 Patient complains of a sore throat for 3 days. Took Ibuprofen for pain 3 hours ago. Rates pain 7/10.

## 2024-06-27 NOTE — ED Provider Notes (Signed)
 [facility] [place]  Note:  This document was prepared using Conservation officer, historic buildings and may include unintentional dictation errors.  MRN: [id number] DOB: December 31, 1990  Subjective:   Tiffany [name] is a 33 y.o. female presenting for sore throat with severe erythema and exudate x 3 days.  Patient has been taking ibuprofen for pain last dose was 3 hours ago but states the pain is still 7 out of 10.  Patient is concern for strep pharyngitis.  Patient denies ever having strep in the past but describes pain as razor blades in the throat .  Patient denies any cough, nasal congestion, body aches, fever, nausea or vomiting.  No current facility-administered medications for this encounter.  Current Outpatient Medications:    cephALEXin  (KEFLEX ) 500 MG capsule, Take 1 capsule (500 mg total) by mouth 2 (two) times daily for 10 days., Disp: 20 capsule, Rfl: 0   cetirizine (ZYRTEC) 10 MG tablet, Take 10 mg by mouth daily., Disp: , Rfl:    cyclobenzaprine  (FLEXERIL ) 5 MG tablet, Take 1 tablet (5 mg total) by mouth 3 (three) times daily as needed for muscle spasms., Disp: 30 tablet, Rfl: 1   Echinacea 125 MG CAPS, Take 1-2 capsules by mouth daily., Disp: , Rfl:    Melatonin 10 MG TABS, Take 10 mg by mouth at bedtime as needed (sleep)., Disp: , Rfl:    methocarbamol  (ROBAXIN ) 500 MG tablet, Take 1 tablet (500 mg total) by mouth 4 (four) times daily., Disp: 40 tablet, Rfl: 0   methylphenidate (RITALIN LA) 10 MG 24 hr capsule, Take 20 mg by mouth daily., Disp: , Rfl:    PEPPERMINT OIL PO, Take 1 capsule by mouth daily., Disp: , Rfl:    Probiotic Product (PROBIOTIC DAILY PO), Take 2 capsules by mouth daily., Disp: , Rfl:    QUEtiapine (SEROQUEL) 25 MG tablet, Take 25 mg by mouth at bedtime., Disp: , Rfl:    rivaroxaban  (XARELTO ) 10 MG TABS tablet, Take 1 tablet (10 mg total) by mouth daily for 21 days., Disp: 21 tablet, Rfl: 0   sertraline (ZOLOFT) 50 MG tablet, Take 100 mg by mouth daily., Disp:  , Rfl:    SUMAtriptan  (IMITREX ) 50 MG tablet, Take 1 tablet (50 mg total) by mouth every 2 (two) hours as needed for migraine. May repeat in 2 hours if headache persists or recurs., Disp: 10 tablet, Rfl: 1   Allergies  Allergen Reactions   Penicillins Rash    Past Medical History:  Diagnosis Date   ADHD (attention deficit hyperactivity disorder)    Allergic rhinitis    Allergy    Anxiety    Depression    Heart murmur    Migraine with aura    Mononucleosis    33 yo     Past Surgical History:  Procedure Laterality Date   KNEE ARTHROSCOPY WITH ANTERIOR CRUCIATE LIGAMENT (ACL) REPAIR Right 10/19/2021   Procedure: KNEE ARTHROSCOPY EVALUATION UNDER ANESTHESIA, PARTIAL LATERAL MENISECTOMY ,  ALLOGRAFT ANTERIOR CRUCIATE LIGAMENT RECONSTRUCTION, POSTERIOR CRUCIATE LIGAMENT RECONSTRUCTION, MEDIAL COLLATERAL LIGAMENT RECONSTRUCTION;  Surgeon: Gerome Charleston, MD;  Location: [facility];  Service: Orthopedics;  Laterality: Right;  WITH ADDUCTOR CANAL 210 MIN   WISDOM TOOTH EXTRACTION      Family History  Problem Relation Age of Onset   Clotting disorder Mother        has had frequent DVTs   Colon polyps Mother        was told they were cancerous   Healthy Father  Healthy Sister    Migraines Maternal Grandmother    Healthy Sister    Pancreatic cancer Cousin        second cousin   Breast cancer Neg Hx    Ovarian cancer Neg Hx     Social History   Tobacco Use   Smoking status: Former    Types: Cigarettes   Smokeless tobacco: Never   Tobacco comments:    1-2 cigarettes per week  Vaping Use   Vaping status: Never Used  Substance Use Topics   Alcohol use: Yes    Alcohol/week: 1.0 standard drink of alcohol    Types: 1 Standard drinks or equivalent per week    Comment: 1/week   Drug use: No    ROS Refer to HPI for ROS details.  Objective:   Vitals: BP 119/78 (BP Location: Left Arm)   Pulse 85   Temp 98.2 F (36.8 C) (Oral)   Resp 17   LMP 06/04/2024 (Approximate)    SpO2 95%   Physical Exam Vitals and nursing note reviewed.  Constitutional:      General: She is not in acute distress.    Appearance: She is well-developed. She is not ill-appearing or toxic-appearing.  HENT:     Head: Normocephalic and atraumatic.     Nose: Nose normal. No congestion or rhinorrhea.     Mouth/Throat:     Mouth: Mucous membranes are moist.     Pharynx: Pharyngeal swelling, oropharyngeal exudate and posterior oropharyngeal erythema present.     Tonsils: Tonsillar exudate present. No tonsillar abscesses. 2+ on the right. 2+ on the left.  Eyes:     Extraocular Movements:     Right eye: Normal extraocular motion.     Left eye: Normal extraocular motion.     Conjunctiva/sclera: Conjunctivae normal.     Pupils: Pupils are equal, round, and reactive to light.  Cardiovascular:     Rate and Rhythm: Normal rate.  Pulmonary:     Effort: Pulmonary effort is normal. No respiratory distress.  Musculoskeletal:        General: Normal range of motion.     Cervical back: Normal range of motion and neck supple.  Lymphadenopathy:     Cervical: No cervical adenopathy.  Skin:    General: Skin is warm and dry.  Neurological:     General: No focal deficit present.     Mental Status: She is alert and oriented to person, place, and time.  Psychiatric:        Mood and Affect: Mood normal.        Behavior: Behavior normal.     Procedures  Results for orders placed or performed during the hospital encounter of 06/27/24 (from the past 24 hours)  POCT rapid strep A     Status: Abnormal   Collection Time: 06/27/24 10:04 AM  Result Value Ref Range   Rapid Strep A Screen Positive (A) Negative    No results found.   Assessment and Plan :     Discharge Instructions       1. Strep pharyngitis (Primary) - POCT rapid strep A performed in UC is positive for strep pharyngitis - cephALEXin  (KEFLEX ) 500 MG capsule; Take 1 capsule (500 mg total) by mouth 2 (two) times daily for 10  days.  Dispense: 20 capsule; Refill: 0 - Take ibuprofen 600 to 800 mg every 6-8 hours as needed for throat inflammation and pain. - Use of over-the-counter sore throat lozenges or cough drops may help decrease  symptoms and soothe throat. - Continue to monitor symptoms for any change in severity if there is any escalation of current symptoms or development of new symptoms follow-up for further evaluation and management.      Diavion Labrador B Krue Peterka   Dejia Ebron, [place], [place] 06/27/24 1013

## 2024-08-05 DIAGNOSIS — F419 Anxiety disorder, unspecified: Secondary | ICD-10-CM | POA: Diagnosis not present

## 2024-08-05 DIAGNOSIS — Z131 Encounter for screening for diabetes mellitus: Secondary | ICD-10-CM | POA: Diagnosis not present

## 2024-08-05 DIAGNOSIS — Z13220 Encounter for screening for lipoid disorders: Secondary | ICD-10-CM | POA: Diagnosis not present

## 2024-08-05 DIAGNOSIS — F988 Other specified behavioral and emotional disorders with onset usually occurring in childhood and adolescence: Secondary | ICD-10-CM | POA: Diagnosis not present

## 2024-08-05 DIAGNOSIS — Z0000 Encounter for general adult medical examination without abnormal findings: Secondary | ICD-10-CM | POA: Diagnosis not present

## 2024-08-05 DIAGNOSIS — Z1331 Encounter for screening for depression: Secondary | ICD-10-CM | POA: Diagnosis not present

## 2024-10-21 DIAGNOSIS — J069 Acute upper respiratory infection, unspecified: Secondary | ICD-10-CM | POA: Diagnosis not present
